# Patient Record
Sex: Female | Born: 1965
Health system: Southern US, Community
[De-identification: ages and names within clinical notes are randomized; demographics above are authoritative.]

## PROBLEM LIST (undated history)

## (undated) DIAGNOSIS — M8580 Other specified disorders of bone density and structure, unspecified site: Secondary | ICD-10-CM

## (undated) DIAGNOSIS — R569 Unspecified convulsions: Secondary | ICD-10-CM

## (undated) DIAGNOSIS — F419 Anxiety disorder, unspecified: Secondary | ICD-10-CM

## (undated) DIAGNOSIS — L409 Psoriasis, unspecified: Secondary | ICD-10-CM

## (undated) DIAGNOSIS — C439 Malignant melanoma of skin, unspecified: Secondary | ICD-10-CM

## (undated) DIAGNOSIS — G35 Multiple sclerosis: Secondary | ICD-10-CM

## (undated) DIAGNOSIS — C801 Malignant (primary) neoplasm, unspecified: Secondary | ICD-10-CM

## (undated) DIAGNOSIS — R519 Headache, unspecified: Secondary | ICD-10-CM

## (undated) DIAGNOSIS — T7840XA Allergy, unspecified, initial encounter: Secondary | ICD-10-CM

## (undated) DIAGNOSIS — E7800 Pure hypercholesterolemia, unspecified: Secondary | ICD-10-CM

## (undated) DIAGNOSIS — H539 Unspecified visual disturbance: Secondary | ICD-10-CM

## (undated) DIAGNOSIS — R51 Headache: Secondary | ICD-10-CM

## (undated) HISTORY — DX: Multiple sclerosis: G35

## (undated) HISTORY — DX: Allergy, unspecified, initial encounter: T78.40XA

## (undated) HISTORY — DX: Headache, unspecified: R51.9

## (undated) HISTORY — DX: Psoriasis, unspecified: L40.9

## (undated) HISTORY — DX: Malignant melanoma of skin, unspecified: C43.9

## (undated) HISTORY — DX: Headache: R51

## (undated) HISTORY — DX: Pure hypercholesterolemia, unspecified: E78.00

## (undated) HISTORY — DX: Anxiety disorder, unspecified: F41.9

## (undated) HISTORY — DX: Unspecified visual disturbance: H53.9

## (undated) HISTORY — PX: MELANOMA EXCISION: SHX5266

## (undated) HISTORY — DX: Unspecified convulsions: R56.9

## (undated) HISTORY — DX: Other specified disorders of bone density and structure, unspecified site: M85.80

## (undated) HISTORY — DX: Malignant (primary) neoplasm, unspecified: C80.1

## (undated) HISTORY — PX: OTHER SURGICAL HISTORY: SHX169

---

## 1991-02-20 HISTORY — PX: BREAST BIOPSY: SHX20

## 2008-08-03 ENCOUNTER — Ambulatory Visit: Payer: Self-pay | Admitting: Family Medicine

## 2008-08-03 DIAGNOSIS — L259 Unspecified contact dermatitis, unspecified cause: Secondary | ICD-10-CM | POA: Insufficient documentation

## 2009-08-01 ENCOUNTER — Ambulatory Visit: Payer: Self-pay | Admitting: Emergency Medicine

## 2009-08-01 DIAGNOSIS — F411 Generalized anxiety disorder: Secondary | ICD-10-CM | POA: Insufficient documentation

## 2009-08-01 DIAGNOSIS — S43429A Sprain of unspecified rotator cuff capsule, initial encounter: Secondary | ICD-10-CM | POA: Insufficient documentation

## 2009-08-01 DIAGNOSIS — M25519 Pain in unspecified shoulder: Secondary | ICD-10-CM | POA: Insufficient documentation

## 2010-02-19 HISTORY — PX: MYOMECTOMY: SHX85

## 2010-03-21 NOTE — Assessment & Plan Note (Signed)
Summary: POISON IVY/OAK/KH   Vital Signs:  Patient Profile:   45 Years Old Female CC:      poison ivy/oak Weight:      133 pounds O2 Sat:      99 % Temp:     97.8 degrees F oral Pulse rate:   78 / minute Resp:     16 per minute BP sitting:   138 / 94  (left arm) Cuff size:   regular  Pt. in pain?   no  Vitals Entered By: Angie Fava (August 03, 2008 6:57 PM)                   Updated Prior Medication List: * ORTHO 777 as directed  Current Allergies: No known allergies  History of Present Illness Chief Complaint: poison ivy/oak History of Present Illness: Subjective:  Patient complains of onset of several pruritic lesions on her arms and legs after pulling weeds 3 days ago.  No systemic symptoms.  She feels well otherwise.   REVIEW OF SYSTEMS Constitutional Symptoms      Denies fever, chills, night sweats, weight loss, weight gain, and fatigue.  Eyes       Denies change in vision, eye pain, eye discharge, glasses, contact lenses, and eye surgery. Ear/Nose/Throat/Mouth       Denies hearing loss/aids, change in hearing, ear pain, ear discharge, dizziness, frequent runny nose, frequent nose bleeds, sinus problems, sore throat, hoarseness, and tooth pain or bleeding.  Respiratory       Denies dry cough, productive cough, wheezing, shortness of breath, asthma, bronchitis, and emphysema/COPD.  Cardiovascular       Denies murmurs, chest pain, and tires easily with exhertion.    Gastrointestinal       Denies stomach pain, nausea/vomiting, diarrhea, constipation, blood in bowel movements, and indigestion. Genitourniary       Denies painful urination, kidney stones, and loss of urinary control. Neurological       Denies paralysis, seizures, and fainting/blackouts. Musculoskeletal       Denies muscle pain, joint pain, joint stiffness, decreased range of motion, redness, swelling, muscle weakness, and gout.  Skin       Denies bruising, unusual mles/lumps or sores, and  hair/skin or nail changes.  Psych       Denies mood changes, temper/anger issues, anxiety/stress, speech problems, depression, and sleep problems.  Past History:  Past Medical History: Unremarkable  Past Surgical History: melanoma removed from left arm May 13, 2008   Family History: mother, father and brother alive and healthy  Social History: drinks rarely denies smoking denies recreational drug use  Objective:  Appearance:  Patient appears healthy, stated age, and in no acute distress  Skin:  right forearm:  1.5 cm dia erythematous macular lesion.  On left forearm and lower legs are several small 2 to 3mm erythematous lesions.    Assessment New Problems: DERMATITIS (ICD-692.9)  ?contact dermatitis vs infected insect bite right arm  Plan New Medications/Changes: TRIAMCINOLONE ACETONIDE 0.1 % CREA (TRIAMCINOLONE ACETONIDE) Apply thin layer to affected area two times a day to three times a day  #15gm x 1, 08/03/2008, Donna Christen MD CEPHALEXIN 500 MG CAPS (CEPHALEXIN) One by mouth two times a day  #14 x 0, 08/03/2008, Donna Christen MD  New Orders: New Patient Level III [id number] Planning Comments:   Oral Keflex for 5 to 7 days.  Triamcinolone cream to lesions two times a day to three times a day. Return for worsening symptoms.  The patient and/or caregiver has been counseled thoroughly with regard to medications prescribed including dosage, schedule, interactions, rationale for use, and possible side effects and they verbalize understanding.  Diagnoses and expected course of recovery discussed and will return if not improved as expected or if the condition worsens. Patient and/or caregiver verbalized understanding.    Prescriptions: TRIAMCINOLONE ACETONIDE 0.1 % CREA (TRIAMCINOLONE ACETONIDE) Apply thin layer to affected area two times a day to three times a day  #15gm x 1   Entered and Authorized by:   Donna Christen MD   Signed by:   Donna Christen MD on  08/03/2008   Method used:   Print then Give to Patient   RxID:   [id number] CEPHALEXIN 500 MG CAPS (CEPHALEXIN) One by mouth two times a day  #14 x 0   Entered and Authorized by:   Donna Christen MD   Signed by:   Donna Christen MD on 08/03/2008   Method used:   Print then Give to Patient   RxID:   [id number]   ] ]

## 2010-03-21 NOTE — Assessment & Plan Note (Signed)
Summary: R shoulder pain, numbness, tingling radiating down R arm x 3 dys   Vital Signs:  Patient Profile:   45 Years Old Female CC:      R shoulder pain, numbness x 3 days Height:     60 inches Weight:      131 pounds O2 Sat:      100 % O2 treatment:    Room Air Temp:     98.0 degrees F oral Pulse rate:   52 / minute Pulse rhythm:   regular Resp:     16 per minute BP sitting:   158 / 104  (right arm) Cuff size:   regular  Vitals Entered By: Areta Haber CMA (August 01, 2009 5:58 PM)                  Current Allergies: No known allergies History of Present Illness History from: patient Chief Complaint: R shoulder pain, numbness x 3 days History of Present Illness: c/o Right shoulder pain x1 month.  No trauma, it just started.  She does work out at [employer]  Mild until 3 days ago when it started getting worse, some radiation to both fingertips.  Pain posterior shoulder and now anterior as well.  No OTC meds help. Pain 5/10  Current Problems: ROTATOR CUFF SPRAIN AND STRAIN (ICD-840.4) SHOULDER, PAIN (ICD-719.41) ANXIETY (ICD-300.00) DERMATITIS (ICD-692.9)   Current Meds LOESTRIN FE 1.5/30 1.5-30 MG-MCG TABS (NORETHIN ACE-ETH ESTRAD-FE) 1 tab by mouth once daily LEXAPRO 5 MG TABS (ESCITALOPRAM OXALATE) 1 tab by mouth once daily WELLBUTRIN XL 150 MG XR24H-TAB (BUPROPION HCL) 1 tab by mouth once daily ULTRACET 37.5-325 MG TABS (TRAMADOL-ACETAMINOPHEN) 1 tab by mouth Q6 hours as needed for pain  REVIEW OF SYSTEMS Constitutional Symptoms      Denies fever, chills, night sweats, weight loss, weight gain, and fatigue.  Eyes       Denies change in vision, eye pain, eye discharge, glasses, contact lenses, and eye surgery. Ear/Nose/Throat/Mouth       Denies hearing loss/aids, change in hearing, ear pain, ear discharge, dizziness, frequent runny nose, frequent nose bleeds, sinus problems, sore throat, hoarseness, and tooth pain or bleeding.  Respiratory       Denies dry  cough, productive cough, wheezing, shortness of breath, asthma, bronchitis, and emphysema/COPD.  Cardiovascular       Denies murmurs, chest pain, and tires easily with exhertion.    Gastrointestinal       Denies stomach pain, nausea/vomiting, diarrhea, constipation, blood in bowel movements, and indigestion. Genitourniary       Denies painful urination, kidney stones, and loss of urinary control. Neurological       Denies paralysis, seizures, and fainting/blackouts. Musculoskeletal       Denies muscle pain, joint pain, joint stiffness, decreased range of motion, redness, swelling, muscle weakness, and gout.  Skin       Denies bruising, unusual mles/lumps or sores, and hair/skin or nail changes.  Psych       Denies mood changes, temper/anger issues, anxiety/stress, speech problems, depression, and sleep problems. Other Comments: R shoulder pain, numbness, radiating down R arm x 3 dys. Pt has not seen PCP for this.   Past History:  Past Medical History:  Anxiety  Family History: mother, father and brother alive and healthy Family History High cholesterol Family History Hypertension Family History of Skin cancer Family History of Stroke M 1st degree relative <50  Social History: drinks rarely denies smoking denies recreational drug use Single Regular  exercise-yes Does Patient Exercise:  yes Physical Exam General appearance: well developed, well nourished, no acute distress Chest/Lungs: no rales, wheezes, or rhonchi bilateral, breath sounds equal without effort Heart: regular rate and  rhythm, no murmur Extremities: Right shoulder: FROM, painful hawkins and neers, scapular dyskinesia, Obrien's neg, scaption painful, TTP rhomboids, none paraspinal, normal speeds and yergusons, no bony tenderness Neurological: grossly intact and non-focal Skin: no obvious rashes or lesions Assessment New Problems: ROTATOR CUFF SPRAIN AND STRAIN (ICD-840.4) SHOULDER, PAIN  (ICD-719.41) ANXIETY (ICD-300.00)   Plan New Medications/Changes: ULTRACET 37.5-325 MG TABS (TRAMADOL-ACETAMINOPHEN) 1 tab by mouth Q6 hours as needed for pain  #30 x 0, 08/01/2009, Hoyt Koch MD  New Orders: New Patient Level III [phone]  The patient and/or caregiver has been counseled thoroughly with regard to medications prescribed including dosage, schedule, interactions, rationale for use, and possible side effects and they verbalize understanding.  Diagnoses and expected course of recovery discussed and will return if not improved as expected or if the condition worsens. Patient and/or caregiver verbalized understanding.  Prescriptions: ULTRACET 37.5-325 MG TABS (TRAMADOL-ACETAMINOPHEN) 1 tab by mouth Q6 hours as needed for pain  #30 x 0   Entered and Authorized by:   Hoyt Koch MD   Signed by:   Hoyt Koch MD on 08/01/2009   Method used:   Handwritten   RxID:   [id number]   Patient Instructions: 1)  f/u with PT for exercises.  Ultracet for pain.  Ice, and gentle ROM.  RTC in a few weeks if not significantly improving.  No Xrays performed today.  Orders Added: 1)  New Patient Level III [02542]

## 2015-09-29 DIAGNOSIS — R4182 Altered mental status, unspecified: Secondary | ICD-10-CM | POA: Diagnosis not present

## 2015-10-07 DIAGNOSIS — D72829 Elevated white blood cell count, unspecified: Secondary | ICD-10-CM | POA: Diagnosis not present

## 2015-10-07 DIAGNOSIS — R404 Transient alteration of awareness: Secondary | ICD-10-CM | POA: Diagnosis not present

## 2015-10-07 DIAGNOSIS — E7800 Pure hypercholesterolemia, unspecified: Secondary | ICD-10-CM | POA: Diagnosis not present

## 2015-10-07 DIAGNOSIS — Z823 Family history of stroke: Secondary | ICD-10-CM | POA: Diagnosis not present

## 2015-11-22 DIAGNOSIS — R9082 White matter disease, unspecified: Secondary | ICD-10-CM | POA: Diagnosis not present

## 2015-11-22 DIAGNOSIS — R479 Unspecified speech disturbances: Secondary | ICD-10-CM | POA: Diagnosis not present

## 2015-11-29 DIAGNOSIS — G43909 Migraine, unspecified, not intractable, without status migrainosus: Secondary | ICD-10-CM | POA: Diagnosis not present

## 2015-11-29 DIAGNOSIS — I6521 Occlusion and stenosis of right carotid artery: Secondary | ICD-10-CM | POA: Diagnosis not present

## 2015-12-08 DIAGNOSIS — R200 Anesthesia of skin: Secondary | ICD-10-CM | POA: Diagnosis not present

## 2015-12-08 DIAGNOSIS — G9389 Other specified disorders of brain: Secondary | ICD-10-CM | POA: Diagnosis not present

## 2015-12-08 DIAGNOSIS — R938 Abnormal findings on diagnostic imaging of other specified body structures: Secondary | ICD-10-CM | POA: Diagnosis not present

## 2015-12-08 DIAGNOSIS — R9089 Other abnormal findings on diagnostic imaging of central nervous system: Secondary | ICD-10-CM | POA: Diagnosis not present

## 2015-12-08 DIAGNOSIS — M5416 Radiculopathy, lumbar region: Secondary | ICD-10-CM | POA: Diagnosis not present

## 2015-12-21 DIAGNOSIS — Z79899 Other long term (current) drug therapy: Secondary | ICD-10-CM | POA: Diagnosis not present

## 2015-12-21 DIAGNOSIS — M5416 Radiculopathy, lumbar region: Secondary | ICD-10-CM | POA: Diagnosis not present

## 2015-12-21 DIAGNOSIS — G5603 Carpal tunnel syndrome, bilateral upper limbs: Secondary | ICD-10-CM | POA: Diagnosis not present

## 2015-12-21 DIAGNOSIS — R9089 Other abnormal findings on diagnostic imaging of central nervous system: Secondary | ICD-10-CM | POA: Diagnosis not present

## 2015-12-21 DIAGNOSIS — R200 Anesthesia of skin: Secondary | ICD-10-CM | POA: Diagnosis not present

## 2016-01-06 DIAGNOSIS — Z23 Encounter for immunization: Secondary | ICD-10-CM | POA: Diagnosis not present

## 2016-01-25 ENCOUNTER — Ambulatory Visit (INDEPENDENT_AMBULATORY_CARE_PROVIDER_SITE_OTHER): Payer: BLUE CROSS/BLUE SHIELD | Admitting: Neurology

## 2016-01-25 ENCOUNTER — Encounter: Payer: Self-pay | Admitting: Neurology

## 2016-01-25 VITALS — BP 118/86 | HR 78 | Resp 16 | Ht 60.0 in | Wt 135.5 lb

## 2016-01-25 DIAGNOSIS — R9089 Other abnormal findings on diagnostic imaging of central nervous system: Secondary | ICD-10-CM

## 2016-01-25 DIAGNOSIS — R404 Transient alteration of awareness: Secondary | ICD-10-CM | POA: Diagnosis not present

## 2016-01-25 DIAGNOSIS — R29898 Other symptoms and signs involving the musculoskeletal system: Secondary | ICD-10-CM

## 2016-01-25 DIAGNOSIS — M7061 Trochanteric bursitis, right hip: Secondary | ICD-10-CM | POA: Diagnosis not present

## 2016-01-25 DIAGNOSIS — M7071 Other bursitis of hip, right hip: Secondary | ICD-10-CM | POA: Insufficient documentation

## 2016-01-25 DIAGNOSIS — F411 Generalized anxiety disorder: Secondary | ICD-10-CM

## 2016-01-25 MED ORDER — METHYLPREDNISOLONE 4 MG PO TABS: ORAL_TABLET | ORAL | 0 refills | Status: DC

## 2016-01-25 MED ORDER — METHYLPREDNISOLONE 4 MG PO TABS
4.0000 mg | ORAL_TABLET | Freq: Every day | ORAL | 0 refills | Status: DC
Start: 2016-01-25 — End: 2016-01-25

## 2016-01-25 NOTE — Progress Notes (Signed)
[facility]  PATIENT: Amy Hopkins DOB: 1966/02/07  REFERRING DOCTOR OR PCP:  Finis Bud SOURCE: Patient, notes from Dr. Dory Larsen and Dr. Trula Ore, MRI reports, lab reports, MRI images on CD  _________________________________   HISTORICAL  CHIEF COMPLAINT:  Chief Complaint  Patient presents with  . Abnormal MRI    Amy Hopkins is here with her husband Amy Hopkins for eval of MS.  Sts. she was dx. on 11-29-15.  Sts. presenting sx. was an episode of altered awareness, while she was speakikng with her husband. "I felt like I was in a tunnel."  She had difficulty speaking--husband sts. speech was slow.  She was seen and treated in the ER at [facility], dx. with altered loc.  Her pcp referred her to [facility].  She saw Dr. Minna Antis, sts. had an MRI which she was told showed MS lesions.  Sts. she had an LP which was   . Speech Disturbance    negative.  She has a hx. of a single sx. in 2011.  Sts. they believe sz. was due to taking Tramadol and Wellbutrin at the same time. She is on Lamictal for same.  She also has a hx. of migraines, onset in her 80's.  Hx. of melanoma left arm a few yrs. ago./fim    HISTORY OF PRESENT ILLNESS:  I had hte pleasure of seeing your patient, Amy Hopkins, at [facility] for a neurologic consultation regarding her possible MS.  IN 2011, She had a generalized tonic-clonic seizure while in the store with her husband. She was taken to the emergency room. She had an EEG and an MRI. The EEG was reportedly normal at that time. The MRI had some white matter foci that were felt to be nonspecific.   She did well until 09/29/2015. She was in her home when she had the onset of generalized weakness. Her arms were at her side. She also was speaking slowly. However, she was able to understand well and there was no aphasia.  She was able to walk independently. She was taken to the emergency room and had a CT scan that was reportedly  normal.  The worst of her symptoms lasted about 15-30 minutes later that day [facility] was practically back to baseline though she still felt a little weak. The next day she felt back to normal. She was advised to follow-up with neurology. She saw Dr. Trula Ore shortly after that. He ordered an EEG and carotid Doppler and MRI scan.   Based on the MRI that showed that there were more white matter foci compared to the previous MRI, a lumbar puncture was also performed. The lumbar puncture was reportedly normal (I do not have those results at this time) .    She went back to see Dr. Trula Ore and he felt that she most likely had multiple sclerosis and Copaxone was prescribed.        The EEG was read as abnormal and she was started on lamotrigine 100 mg by mouth twice a day. She tolerates it well. She has not had more spells.  She had a nerve conduction and EMG study performed. It showed that she had the nerve conduction study showed moderate right and mild left carpal tunnel syndrome and no-shows showed mild cervical and lumbosacral radiculopathies.    Despite study,  she has not reported symptoms consistent with radiculopathy with no pain, numbness or weakness.  She has a long history of migraine headaches. For the most part, these  are common migraine headaches without aura. She will get pounding bilateral pain, photophobia, phonophobia and nausea and sometimes vomiting. Her head will make the pain worse. Imitrex usually helps the pain.  She has not had any spells of weakness, numbness or clumsiness, not counting the one spell of a few months ago.    She has not had significant problems with bladder or vision.  She has some anxiety and is on Lexapro. She has not had cognitive dysfunction. She notes some fatigue and sleepiness. She does sleep well at night with just some snoring but no pauses or gasping her breathing.  There is no family history of MS. Her mother had a brain stem stroke with the locked-in syndrome  and her grandmother had TIAs.  I personally reviewed the MRI of the brain from August 2017. It shows multiple T2/FLAIR hyperintense foci in both hemispheres, predominantly in the subcortical and deep white matter. There was 1:30 oriented periventricular focus on the left. None of the foci appeared to be acute. The brainstem, cerebellum and deep gray matter were normal.  I also reviewed the results of the Doppler study (normal for age) and EEG (read as showing right parietal and temporal slowing) and NCV/EMG (read as showing moderate right and mild left median neuropathies at the wrist and multilevel lumbosacral and cervical radiculopathies)  REVIEW OF SYSTEMS: Constitutional: No fevers, chills, sweats, or change in appetite Eyes: No visual changes, double vision, eye pain Ear, nose and throat: No hearing loss, ear pain, nasal congestion, sore throat Cardiovascular: No chest pain, palpitations Respiratory: No shortness of breath at rest or with exertion.   No wheezes GastrointestinaI: No nausea, vomiting, diarrhea, abdominal pain, fecal incontinence Genitourinary: No dysuria, urinary retention or frequency.  No nocturia. Musculoskeletal: No neck pain, back pain Integumentary: No rash, pruritus, skin lesions Neurological: as above Psychiatric: No depression at this time.  No anxiety Endocrine: No palpitations, diaphoresis, change in appetite, change in weigh or increased thirst Hematologic/Lymphatic: No anemia, purpura, petechiae. Allergic/Immunologic: No itchy/runny eyes, nasal congestion, recent allergic reactions, rashes  ALLERGIES: Allergies  Allergen Reactions  . Tramadol Other (See Comments)    HOME MEDICATIONS:  Current Outpatient Prescriptions:  .  acetaminophen (TYLENOL) 500 MG tablet, Take by mouth., Disp: , Rfl:  .  COPAXONE 40 MG/ML SOSY, , Disp: , Rfl:  .  escitalopram (LEXAPRO) 10 MG tablet, Take by mouth., Disp: , Rfl:  .  fluocinonide (LIDEX) 0.05 % external  solution, , Disp: , Rfl:  .  ibuprofen (ADVIL,MOTRIN) 200 MG tablet, Take by mouth., Disp: , Rfl:  .  lamoTRIgine (LAMICTAL) 100 MG tablet, , Disp: , Rfl:  .  SUMAtriptan (IMITREX) 100 MG tablet, Take by mouth., Disp: , Rfl:  .  SUMAtriptan (IMITREX) 100 MG tablet, , Disp: , Rfl:  .  methylPREDNISolone (MEDROL) 4 MG tablet, Take as directed over 6 days, Disp: 21 tablet, Rfl: 0  PAST MEDICAL HISTORY: Past Medical History:  Diagnosis Date  . Cancer ([place])   . Headache   . Melanoma ([place])   . Multiple sclerosis ([place])   . Psoriasis   . Seizures ([place])   . Vision abnormalities     PAST SURGICAL HISTORY: Past Surgical History:  Procedure Laterality Date  . MELANOMA EXCISION    . MYOMECTOMY      FAMILY HISTORY: Family History  Problem Relation Age of Onset  . Stroke Mother   . Breast cancer Mother   . Hypertension Father   . High Cholesterol Father   .  Psoriasis Father   . Healthy Brother   . Transient ischemic attack Maternal Grandmother     SOCIAL HISTORY:  Social History   Social History  . Marital status: Single    Spouse name: N/A  . Number of children: N/A  . Years of education: N/A   Occupational History  . Not on file.   Social History Main Topics  . Smoking status: Former Research scientist (life sciences)  . Smokeless tobacco: Never Used  . Alcohol use Yes     Comment: occasional  . Drug use: No  . Sexual activity: Not on file   Other Topics Concern  . Not on file   Social History Narrative  . No narrative on file     PHYSICAL EXAM  Vitals:   01/25/16 1346  BP: 118/86  Pulse: 78  Resp: 16  Weight: 135 lb 8 oz (61.5 kg)  Height: 5' (1.524 m)    Body mass index is 26.46 kg/m.   General: The patient is well-developed and well-nourished and in no acute distress  Eyes:  Funduscopic exam shows normal optic discs and retinal vessels.  Head/Neck: Head is Newman/AT.   The neck is supple, no carotid bruits are noted.  The neck is nontender.   Soft palate is  elongated.  Cardiovascular: The heart has a regular rate and rhythm with a normal S1 and S2. There were no murmurs, gallops or rubs. Lungs are clear to auscultation.  Skin: Extremities are without significant edema.  Musculoskeletal:  Back is nontender.  She is tender over the right trochanteric bursa consistent with bursitis.  Neurologic Exam  Mental status: The patient is alert and oriented x 3 at the time of the examination. The patient has apparent normal recent and remote memory, with an apparently normal attention span and concentration ability.   Speech is normal.  Cranial nerves: Extraocular movements are full. Pupils are equal, round, and reactive to light and accomodation.  Visual fields are full.  Facial symmetry is present. There is good facial sensation to soft touch bilaterally.Facial strength is normal.  Trapezius and sternocleidomastoid strength is normal. No dysarthria is noted.  The tongue is midline, and the patient has symmetric elevation of the soft palate. No obvious hearing deficits are noted.  Motor:  Muscle bulk is normal.   Tone is normal. Strength is  5 / 5 in all 4 extremities.   Sensory: Sensory testing is intact to pinprick, soft touch and vibration sensation in all 4 extremities.  Coordination: Cerebellar testing reveals good finger-nose-finger and heel-to-shin bilaterally.  Gait and station: Station is normal.   Gait is normal. Tandem gait is normal. Romberg is negative.   Reflexes: Deep tendon reflexes are symmetric and normal bilaterally.   Plantar responses are flexor.      DIAGNOSTIC DATA (LABS, IMAGING, TESTING) - I reviewed patient records, labs, notes, testing and imaging myself where available.      ASSESSMENT AND PLAN  Abnormal brain MRI - Plan: ECHOCARDIOGRAM COMPLETE, MR CERVICAL SPINE W WO CONTRAST  Transient alteration of awareness - Plan: ECHOCARDIOGRAM COMPLETE  Arm weakness - Plan: MR CERVICAL SPINE W WO CONTRAST  Anxiety  state  Trochanteric bursitis of right hip   In summary, Jahasia Naimi is a 50 year old woman with an abnormal MRI showing multiple white matter foci, predominantly in the subcortical and deep white matter.    She has not had any episodes of numbness, weakness, vision changes or clumsiness as would be expected with multiple sclerosis. I personally reviewed  the MRI of the brain and concur that some of the foci could be consistent with MS but that most of the foci are nonspecific. I have requested the 2011 MRI to do a side-by-side comparison. I discussed with her that without clinical relapse history (August symptoms not typical for MS), normal CSF and borderline MRI, I feel that the chance that she has MS is closer to 30-50% rather than a higher likelihood.  Therefore, I would not recommend starting a disease modifying therapy at this time. I would like to recheck an MRI of the brain in one year to see if there has been progression. Additionally, we will go ahead and check an MRI of the cervical spine to determine if there are any plaques consistent with MS in the spinal cord. If present, or if a later MRI shows changes consistent with MS, this will greatly increase the likelihood of MS and then I would want her to start a disease modifying therapy.     Her MRI showed some right-sided slowing and I am uncertain if that explains her symptoms she experienced in August or not but I recommend that she continue the lamotrigine at this time and I would consider stopping later if she remains episode free.   I am not sure what to make of the multilevel radiculopathy on her EMG study as she is not symptomatic with no pain, numbness, weakness or atrophy. The MRI of the cervical spine will help to clarify this as well. The foci on the brain MRI could represent cardial emboli and we will check a bubble contrasted echocardiogram.   Unrelated to her other symptoms, she has right trochanteric bursa tenderness. I called in a  steroid pack and could do a bursa injection if pain does not improve.  She will return to see me in 2 months or sooner if there are new or worsening neurologic symptoms and we will let her know the results of the studies as they are performed.  Thank you for asking me to see Mrs. Berline Lopes for a neurologic consultation. Please let me know if I can be of further assistance with her or other patients in the future.   Jhanvi Drakeford A. Felecia Shelling, MD, PhD [id number], [id number] PM Certified in Neurology, Clinical Neurophysiology, Sleep Medicine, Pain Medicine and Neuroimaging  [facility] [address] [place], [place] [id number] [phone]

## 2016-01-27 ENCOUNTER — Telehealth: Payer: Self-pay | Admitting: *Deleted

## 2016-01-27 DIAGNOSIS — M50221 Other cervical disc displacement at C4-C5 level: Secondary | ICD-10-CM | POA: Diagnosis not present

## 2016-01-27 NOTE — Telephone Encounter (Signed)
Release faxed to [facility] requesting mri brain/spine 2011.

## 2016-01-30 ENCOUNTER — Ambulatory Visit (INDEPENDENT_AMBULATORY_CARE_PROVIDER_SITE_OTHER): Payer: Self-pay

## 2016-01-30 DIAGNOSIS — R29898 Other symptoms and signs involving the musculoskeletal system: Secondary | ICD-10-CM

## 2016-01-30 DIAGNOSIS — Z0289 Encounter for other administrative examinations: Secondary | ICD-10-CM

## 2016-01-30 DIAGNOSIS — R9089 Other abnormal findings on diagnostic imaging of central nervous system: Secondary | ICD-10-CM

## 2016-02-01 ENCOUNTER — Telehealth: Payer: Self-pay | Admitting: *Deleted

## 2016-02-01 NOTE — Telephone Encounter (Signed)
-----   Message from Britt Bottom, MD sent at 01/31/2016  6:00 PM EST ----- (may be duplicate)   please let her know that the MRI of the cervical spine showsarthritic and disc degenerative changes with spinal stenosis, however,  the spinal cord appears normal (no evidence of MS)

## 2016-02-01 NOTE — Telephone Encounter (Signed)
I have spoken with Amy Hopkins this morning, and per RAS, explained that MRI cervical spine showed arthritic, degenerative wear and tear changes, also some mild narrowing, but spinal cord looks normal and there is no evidence of MS. She verbalized understanding of same, wonders when ECHO will be sched.  I will check with Hinton Dyer on this/fim

## 2016-02-01 NOTE — Telephone Encounter (Signed)
duplicate/fim  

## 2016-02-01 NOTE — Telephone Encounter (Signed)
-----   Message from Britt Bottom, MD sent at 01/31/2016  5:58 PM EST ----- Please let her know that the MRI of the cervical spine shows arthritic and disc degenerative changes with mild spinal stenosis. However, the spinal cord appears normal (no evidence of MS)

## 2016-02-02 NOTE — Telephone Encounter (Signed)
Spoke to Patient she is scheduled for Her Echo  Jan 3rd arrive at 1:45 for 2:00 apt . Patient has been approved auth # [id number] 02/01/2017 01/01-2017

## 2016-02-02 NOTE — Telephone Encounter (Signed)
Noted/fim 

## 2016-02-07 ENCOUNTER — Telehealth: Payer: Self-pay | Admitting: Neurology

## 2016-02-07 NOTE — Telephone Encounter (Signed)
I compared the MRI's of the brain from 08/24/2009 and 11/22/2015.    Both showed nonspecific white matter foci predominantly in the subcortical and deep white matter of both hemispheres. In the 6 year interim, a couple more foci have developed though the pattern remains nonspecific.

## 2016-02-17 ENCOUNTER — Other Ambulatory Visit: Payer: Self-pay

## 2016-02-17 ENCOUNTER — Ambulatory Visit (HOSPITAL_COMMUNITY): Payer: BLUE CROSS/BLUE SHIELD | Attending: Cardiovascular Disease

## 2016-02-17 DIAGNOSIS — R9089 Other abnormal findings on diagnostic imaging of central nervous system: Secondary | ICD-10-CM | POA: Diagnosis not present

## 2016-02-17 DIAGNOSIS — R404 Transient alteration of awareness: Secondary | ICD-10-CM

## 2016-02-22 ENCOUNTER — Other Ambulatory Visit (HOSPITAL_COMMUNITY): Payer: Self-pay

## 2016-02-22 NOTE — Telephone Encounter (Signed)
LMOM that per RAS, echo was ok.  She does not need to return this call unless she has questions/fim

## 2016-02-22 NOTE — Telephone Encounter (Signed)
-----   Message from Britt Bottom, MD sent at 02/22/2016  9:37 AM EST ----- Please note that the echocardiogram was fine.

## 2016-03-27 ENCOUNTER — Encounter: Payer: Self-pay | Admitting: Neurology

## 2016-03-27 ENCOUNTER — Ambulatory Visit (INDEPENDENT_AMBULATORY_CARE_PROVIDER_SITE_OTHER): Payer: BLUE CROSS/BLUE SHIELD | Admitting: Neurology

## 2016-03-27 ENCOUNTER — Telehealth: Payer: Self-pay | Admitting: Neurology

## 2016-03-27 VITALS — BP 120/78 | HR 64 | Resp 14 | Ht 60.0 in | Wt 137.0 lb

## 2016-03-27 DIAGNOSIS — R404 Transient alteration of awareness: Secondary | ICD-10-CM | POA: Diagnosis not present

## 2016-03-27 DIAGNOSIS — R9089 Other abnormal findings on diagnostic imaging of central nervous system: Secondary | ICD-10-CM | POA: Diagnosis not present

## 2016-03-27 DIAGNOSIS — F411 Generalized anxiety disorder: Secondary | ICD-10-CM | POA: Diagnosis not present

## 2016-03-27 DIAGNOSIS — G43009 Migraine without aura, not intractable, without status migrainosus: Secondary | ICD-10-CM

## 2016-03-27 DIAGNOSIS — G43909 Migraine, unspecified, not intractable, without status migrainosus: Secondary | ICD-10-CM | POA: Insufficient documentation

## 2016-03-27 NOTE — Telephone Encounter (Signed)
PT NEXT APPT SATER 09/25/16. PER PT SATER WANTS MRI DONE ONE OR TWO WEEKS PRIOR TO AUG APPT. PT STATES NEEDS OPEN MRI

## 2016-03-27 NOTE — Progress Notes (Signed)
[facility]  PATIENT: Amy Hopkins DOB: [birth year]  REFERRING DOCTOR OR PCP:  Finis Bud SOURCE: Patient, notes from Dr. Dory Larsen and Dr. Trula Ore, MRI reports, lab reports, MRI images on CD  _________________________________   HISTORICAL  CHIEF COMPLAINT:  Chief Complaint  Patient presents with  . Abnormal MRI    Here for recheck--currently is being r/o for MS.  Would like to discuss next MRI.   Denies new or worsening sx. /fim    HISTORY OF PRESENT ILLNESS:  I had hte pleasure of seeing your patient, Amy Hopkins, at [facility] for a neurologic consultation regarding her possible MS.  Transient alteration of awareness.   She had a seizure in 2011 and an episode Of altered awareness with weakness August 2017.  The first spell occurred in the setting of Wellbutrin and tramadol as risk factors and she had generalized tonic-clonic activity. The second spell did not have any precipitating factor though it is uncertain whether it represented a seizure as there was no loss of consciousness.   When she was a teenager, she had a couple episodes of very brief syncope preceded by lightheadedness but the other spells were very different.     She went to [facility] emergency room and was referred to Dr. Trula Ore.  The EEG was read as abnormal (right parietal and temporal slowing) and she was started on lamotrigine 100 mg by mouth twice a day. She tolerates it well. She has not had more spells.  Her mother had a brain stem stroke with the locked-in syndrome and her grandmother had TIAs.  Abnormal MRI/Possible MS:  Because of the spell in August, she had an MRI in September.   It shows some white matter foci. I was able to get her previous MRI from 2011 and compared. There were abnormal white matter foci on the prior scan that had been some progression in the interim.   There was a concern about multiple sclerosis and she was prescribed Copaxone and referred  for second opinion.   She has not had any episodes of weakness, clumsiness, vision changes, gait changes persisting for more than a few minutes.  He does get intermittent numbness and tingling in the hands (see below).  We had previously discussed that the MRI changes could represent chronic microvascular ischemic change rather than MS though some of the foci are periventricular.   A lumbar puncture was reportedly negative.  here is no family history of MS.   Migraine:    She has a long history of migraine headaches. For the most part, these are common migraine headaches without aura. She will get pounding bilateral pain, photophobia, phonophobia and nausea and sometimes vomiting. Her head will make the pain worse. Imitrex usually helps the pain.   These are actually occurring less since starting lamotrigine.  Hand numbness, carpal tunnel syndrome:   She had a nerve conduction and EMG study performed. It showed that she had the nerve conduction study showed moderate right and mild left carpal tunnel syndrome and no-shows showed mild cervical and lumbosacral radiculopathies.    Anxiety:  She has some anxiety and is on Lexapro. She has not had cognitive dysfunction. She notes some fatigue and sleepiness. She does sleep well at night with just some snoring but no pauses or gasping her breathing.  I have reviewed the MRI of the brain from August 2017. It shows multiple T2/FLAIR hyperintense foci in both hemispheres, predominantly in the subcortical and deep white matter. There  was radially oriented periventricular focus on the left. None of the foci appeared to be acute. The brainstem, cerebellum and deep gray matter were normal.  I also reviewed the results of the Doppler study (normal for age) and EEG (read as showing right parietal and temporal slowing) and NCV/EMG (read as showing moderate right and mild left median neuropathies at the wrist and multilevel lumbosacral and cervical radiculopathies)  REVIEW  OF SYSTEMS: Constitutional: No fevers, chills, sweats, or change in appetite Eyes: No visual changes, double vision, eye pain Ear, nose and throat: No hearing loss, ear pain, nasal congestion, sore throat Cardiovascular: No chest pain, palpitations Respiratory: No shortness of breath at rest or with exertion.   No wheezes GastrointestinaI: No nausea, vomiting, diarrhea, abdominal pain, fecal incontinence Genitourinary: No dysuria, urinary retention or frequency.  No nocturia. Musculoskeletal: No neck pain, back pain Integumentary: No rash, pruritus, skin lesions Neurological: as above Psychiatric: No depression at this time.  No anxiety Endocrine: No palpitations, diaphoresis, change in appetite, change in weigh or increased thirst Hematologic/Lymphatic: No anemia, purpura, petechiae. Allergic/Immunologic: No itchy/runny eyes, nasal congestion, recent allergic reactions, rashes  ALLERGIES: Allergies  Allergen Reactions  . Tramadol Other (See Comments)    HOME MEDICATIONS:  Current Outpatient Prescriptions:  .  acetaminophen (TYLENOL) 500 MG tablet, Take by mouth., Disp: , Rfl:  .  COPAXONE 40 MG/ML SOSY, , Disp: , Rfl:  .  escitalopram (LEXAPRO) 10 MG tablet, Take by mouth., Disp: , Rfl:  .  fluocinonide (LIDEX) 0.05 % external solution, , Disp: , Rfl:  .  ibuprofen (ADVIL,MOTRIN) 200 MG tablet, Take by mouth., Disp: , Rfl:  .  lamoTRIgine (LAMICTAL) 100 MG tablet, , Disp: , Rfl:  .  methylPREDNISolone (MEDROL) 4 MG tablet, Take as directed over 6 days, Disp: 21 tablet, Rfl: 0 .  SUMAtriptan (IMITREX) 100 MG tablet, Take by mouth., Disp: , Rfl:  .  SUMAtriptan (IMITREX) 100 MG tablet, , Disp: , Rfl:   PAST MEDICAL HISTORY: Past Medical History:  Diagnosis Date  . Cancer ([place])   . Headache   . Melanoma ([place])   . Multiple sclerosis ([place])   . Psoriasis   . Seizures ([place])   . Vision abnormalities     PAST SURGICAL HISTORY: Past Surgical History:  Procedure Laterality  Date  . MELANOMA EXCISION    . MYOMECTOMY      FAMILY HISTORY: Family History  Problem Relation Age of Onset  . Stroke Mother   . Breast cancer Mother   . Hypertension Father   . High Cholesterol Father   . Psoriasis Father   . Healthy Brother   . Transient ischemic attack Maternal Grandmother     SOCIAL HISTORY:  Social History   Social History  . Marital status: Single    Spouse name: N/A  . Number of children: N/A  . Years of education: N/A   Occupational History  . Not on file.   Social History Main Topics  . Smoking status: Former Research scientist (life sciences)  . Smokeless tobacco: Never Used  . Alcohol use Yes     Comment: occasional  . Drug use: No  . Sexual activity: Not on file   Other Topics Concern  . Not on file   Social History Narrative  . No narrative on file     PHYSICAL EXAM  Vitals:   03/27/16 1554  BP: 120/78  Pulse: 64  Resp: 14  Weight: 137 lb (62.1 kg)  Height: 5' (1.524 m)  Body mass index is 26.76 kg/m.   General: The patient is well-developed and well-nourished and in no acute distress   Neurologic Exam  Mental status: The patient is alert and oriented x 3 at the time of the examination. The patient has apparent normal recent and remote memory, with an apparently normal attention span and concentration ability.   Speech is normal.  Cranial nerves: Extraocular movements are full.  There is good facial sensation to soft touch bilaterally.Facial strength is normal.  Trapezius and sternocleidomastoid strength is normal. No dysarthria is noted.  The tongue is midline, and the patient has symmetric elevation of the soft palate. No obvious hearing deficits are noted.  Motor:  Muscle bulk is normal.   Tone is normal. Strength is  5 / 5 in all 4 extremities.   Sensory: Sensory testing is intact to pinprick, soft touch and vibration sensation in all 4 extremities.  Coordination: Cerebellar testing reveals good finger-nose-finger and heel-to-shin  bilaterally.  Gait and station: Station is normal.   Gait is normal. Tandem gait is normal. Romberg is negative.   Reflexes: Deep tendon reflexes are symmetric.   DTRs were 3 and symmetric at the knees and 2+ elsewherer.      DIAGNOSTIC DATA (LABS, IMAGING, TESTING) - I reviewed patient records, labs, notes, testing and imaging myself where available.      ASSESSMENT AND PLAN  Abnormal brain MRI  Transient alteration of awareness  Anxiety state  Migraine without aura and without status migrainosus, not intractable   1.   We had a long discussion about the MRIs showing white matter foci but no definite evidence of multiple sclerosis.   I feel the likelihood that she has MS is 30-40%.   Would not recommend treatment at this time.  I would like to check another MRI of the brain later this year and compare with her previous one. If that one does not show any progression, the likelihood of MS is even lower. 2.   For the time being, she should continue on lamotrigine. However, she could consider going off as it is not certain that the episode in August was a seizure he showed mild asymmetry but not definite epileptiform activity. 3.    She will return to see me in 6 months and we will check an MRI around that time.  He should call sooner if any new or worsening neurologic symptoms.  Kevyn Boquet A. Felecia Shelling, MD, PhD [id number], [id number] PM Certified in Neurology, Clinical Neurophysiology, Sleep Medicine, Pain Medicine and Neuroimaging  [facility] [address] [place], [place] [address] [phone]

## 2016-03-28 NOTE — Telephone Encounter (Signed)
Noted, thank you

## 2016-04-09 DIAGNOSIS — Z823 Family history of stroke: Secondary | ICD-10-CM | POA: Diagnosis not present

## 2016-04-09 DIAGNOSIS — Z0000 Encounter for general adult medical examination without abnormal findings: Secondary | ICD-10-CM | POA: Diagnosis not present

## 2016-04-09 DIAGNOSIS — G43909 Migraine, unspecified, not intractable, without status migrainosus: Secondary | ICD-10-CM | POA: Diagnosis not present

## 2016-04-09 DIAGNOSIS — E559 Vitamin D deficiency, unspecified: Secondary | ICD-10-CM | POA: Diagnosis not present

## 2016-04-09 DIAGNOSIS — L409 Psoriasis, unspecified: Secondary | ICD-10-CM | POA: Diagnosis not present

## 2016-04-09 DIAGNOSIS — Z1231 Encounter for screening mammogram for malignant neoplasm of breast: Secondary | ICD-10-CM | POA: Diagnosis not present

## 2016-04-23 DIAGNOSIS — Z780 Asymptomatic menopausal state: Secondary | ICD-10-CM | POA: Diagnosis not present

## 2016-04-24 ENCOUNTER — Encounter: Payer: Self-pay | Admitting: Neurology

## 2016-05-07 DIAGNOSIS — L400 Psoriasis vulgaris: Secondary | ICD-10-CM | POA: Diagnosis not present

## 2016-05-07 DIAGNOSIS — Z85828 Personal history of other malignant neoplasm of skin: Secondary | ICD-10-CM | POA: Diagnosis not present

## 2016-05-07 DIAGNOSIS — Z85820 Personal history of malignant melanoma of skin: Secondary | ICD-10-CM | POA: Diagnosis not present

## 2016-05-07 DIAGNOSIS — Z1231 Encounter for screening mammogram for malignant neoplasm of breast: Secondary | ICD-10-CM | POA: Diagnosis not present

## 2016-05-07 DIAGNOSIS — L814 Other melanin hyperpigmentation: Secondary | ICD-10-CM | POA: Diagnosis not present

## 2016-05-07 DIAGNOSIS — C44712 Basal cell carcinoma of skin of right lower limb, including hip: Secondary | ICD-10-CM | POA: Diagnosis not present

## 2016-07-30 ENCOUNTER — Telehealth: Payer: Self-pay | Admitting: Neurology

## 2016-07-30 DIAGNOSIS — R9089 Other abnormal findings on diagnostic imaging of central nervous system: Secondary | ICD-10-CM

## 2016-07-30 DIAGNOSIS — R200 Anesthesia of skin: Secondary | ICD-10-CM

## 2016-07-30 DIAGNOSIS — G43809 Other migraine, not intractable, without status migrainosus: Secondary | ICD-10-CM

## 2016-07-30 DIAGNOSIS — R404 Transient alteration of awareness: Secondary | ICD-10-CM

## 2016-07-30 DIAGNOSIS — R569 Unspecified convulsions: Secondary | ICD-10-CM

## 2016-07-30 DIAGNOSIS — Z8669 Personal history of other diseases of the nervous system and sense organs: Secondary | ICD-10-CM

## 2016-07-30 NOTE — Telephone Encounter (Signed)
Pt wants to proceed with MRI

## 2016-07-30 NOTE — Telephone Encounter (Signed)
MRI ordered per plan in last ov note. (continuiing to r/o MS). Hx. sz., migraines, transient alt. of awareness, numbness, abnormal mri brain in the past.  Pt. aware mri has been ordered./fim

## 2016-09-07 ENCOUNTER — Encounter: Payer: Self-pay | Admitting: Neurology

## 2016-09-07 DIAGNOSIS — R569 Unspecified convulsions: Secondary | ICD-10-CM | POA: Diagnosis not present

## 2016-09-07 DIAGNOSIS — G43809 Other migraine, not intractable, without status migrainosus: Secondary | ICD-10-CM | POA: Diagnosis not present

## 2016-09-07 DIAGNOSIS — R200 Anesthesia of skin: Secondary | ICD-10-CM | POA: Diagnosis not present

## 2016-09-07 DIAGNOSIS — R9089 Other abnormal findings on diagnostic imaging of central nervous system: Secondary | ICD-10-CM | POA: Diagnosis not present

## 2016-09-20 ENCOUNTER — Encounter: Payer: Self-pay | Admitting: Neurology

## 2016-09-25 ENCOUNTER — Ambulatory Visit (INDEPENDENT_AMBULATORY_CARE_PROVIDER_SITE_OTHER): Payer: BLUE CROSS/BLUE SHIELD | Admitting: Neurology

## 2016-09-25 ENCOUNTER — Encounter: Payer: Self-pay | Admitting: Neurology

## 2016-09-25 VITALS — BP 124/79 | HR 74 | Wt 124.0 lb

## 2016-09-25 DIAGNOSIS — G43009 Migraine without aura, not intractable, without status migrainosus: Secondary | ICD-10-CM

## 2016-09-25 DIAGNOSIS — R9089 Other abnormal findings on diagnostic imaging of central nervous system: Secondary | ICD-10-CM | POA: Diagnosis not present

## 2016-09-25 DIAGNOSIS — R404 Transient alteration of awareness: Secondary | ICD-10-CM

## 2016-09-25 DIAGNOSIS — R200 Anesthesia of skin: Secondary | ICD-10-CM | POA: Diagnosis not present

## 2016-09-25 MED ORDER — TOPIRAMATE 100 MG PO TABS: 100.0000 mg | ORAL_TABLET | Freq: Every day | ORAL | 5 refills | Status: DC

## 2016-09-25 MED ORDER — SUMATRIPTAN SUCCINATE 100 MG PO TABS: 100.0000 mg | ORAL_TABLET | Freq: Once | ORAL | 11 refills | Status: DC

## 2016-09-25 NOTE — Progress Notes (Signed)
[facility]  PATIENT: Amy Hopkins DOB: 1965/03/11  REFERRING DOCTOR OR PCP:  Finis Bud SOURCE: Patient, notes from Dr. Dory Larsen and Dr. Trula Ore, MRI reports, lab reports, MRI images on CD  _________________________________   HISTORICAL  CHIEF COMPLAINT:  Chief Complaint  Patient presents with  . Abnormal brain MRI    husband- Amy Hopkins, "my right foot/leg goes numb and gets cold and my foot drags, happened 3 times; shooting pains in my wrists and arms; feel shock in the right side of my neck; my words get jumbled sometimes"  . Follow-up    6 month    HISTORY OF PRESENT ILLNESS:  Amy Hopkins is a 50 yo woman with an  abnormal MRI who has had possible seizure activity and also reports several episodes of right leg/foot numbness and weakness and other symptoms.     On 09/07/2016, she had an MRI performed at [facility]. We don't have access to those images but we will try to obtain them. The radiologist reported that there was no significant change in the multiple scattered white matterfoci compared to the 2017 MRI.     Spells:   She has had 3 spells of her right foot dragging and numbness.   She feels the right foot inverts some.  There was an uncomfortable feeling in the leg.   She also noted some tingling with shooting pain in the right arm.   She denies neck pain.     She gets occasional episodes of word jumbling.       Transient alteration of awareness.   She had a seizure in 2011 and also had an episode of altered awareness in August 2017 the first episode in 2011 did have generalized tonic-clonic activity and occurred while on Wellbutrin and tramadol. . The second spell did not have any precipitating factor though it is uncertain whether it represented a seizure as there was no loss of consciousness.     She went to [facility] emergency room and was referred to Dr. Trula Ore.  The EEG was read as abnormal (right parietal and temporal slowing) and she was  started on lamotrigine 100 mg by mouth twice a day. She tolerates it well. She has not had more spells.  She notes that she often is his one of her lamotrigine doses.   Abnormal MRI/Possible MS:  Because of the spell in August 2017, she had an MRI.   It shows white matter changes that have progressed somewhat compared to the MRI dated 2011. She was felt by Dr. Clydene Laming time to have MS and she was prescribed Copaxone but never took it.  She was referred to me as a second opinion..   She has not had any episodes of weakness, clumsiness, vision changes, gait changes persisting for more than a few minutes.  He does get intermittent numbness and tingling in the hands (see below).  We had previously discussed that the MRI changes could represent chronic microvascular ischemic change rather than MS though some of the foci are periventricular.   A lumbar puncture was reportedly negative.  There is no family history of MS.   Migraine:   She has 3 -4 migraines a month, usually improved with  She has a long history of migraine headaches. For the most part, these are common migraine headaches without aura. She will get pounding bilateral pain, photophobia, phonophobia and nausea and sometimes vomiting. Her head will make the pain worse. Imitrex usually helps the pain.   These  are actually occurring less since starting lamotrigine.  CTS:   She had a nerve conduction and EMG study performed. It showed that she had the nerve conduction study showed moderate right and mild left carpal tunnel syndrome and no-shows showed mild cervical and lumbosacral radiculopathies.    Anxiety:  She has some anxiety and is on Lexapro. She has not had cognitive dysfunction. She notes some fatigue and sleepiness. She does sleep well at night with just some snoring but no pauses or gasping her breathing.  REVIEW OF SYSTEMS: Constitutional: No fevers, chills, sweats, or change in appetite Eyes: No visual changes, double vision, eye pain Ear,  nose and throat: No hearing loss, ear pain, nasal congestion, sore throat Cardiovascular: No chest pain, palpitations Respiratory: No shortness of breath at rest or with exertion.   No wheezes GastrointestinaI: No nausea, vomiting, diarrhea, abdominal pain, fecal incontinence Genitourinary: No dysuria, urinary retention or frequency.  No nocturia. Musculoskeletal: No neck pain, back pain Integumentary: No rash, pruritus, skin lesions Neurological: as above Psychiatric: No depression at this time.  No anxiety Endocrine: No palpitations, diaphoresis, change in appetite, change in weigh or increased thirst Hematologic/Lymphatic: No anemia, purpura, petechiae. Allergic/Immunologic: No itchy/runny eyes, nasal congestion, recent allergic reactions, rashes  ALLERGIES: Allergies  Allergen Reactions  . Tramadol Other (See Comments)    HOME MEDICATIONS:  Current Outpatient Prescriptions:  .  acetaminophen (TYLENOL) 500 MG tablet, Take by mouth., Disp: , Rfl:  .  escitalopram (LEXAPRO) 10 MG tablet, Take by mouth., Disp: , Rfl:  .  fluocinonide (LIDEX) 0.05 % external solution, , Disp: , Rfl:  .  ibuprofen (ADVIL,MOTRIN) 200 MG tablet, Take by mouth., Disp: , Rfl:  .  methylPREDNISolone (MEDROL) 4 MG tablet, Take as directed over 6 days, Disp: 21 tablet, Rfl: 0 .  SUMAtriptan (IMITREX) 100 MG tablet, Take 1 tablet (100 mg total) by mouth once., Disp: 10 tablet, Rfl: 11 .  topiramate (TOPAMAX) 100 MG tablet, Take 1 tablet (100 mg total) by mouth at bedtime., Disp: 30 tablet, Rfl: 5  PAST MEDICAL HISTORY: Past Medical History:  Diagnosis Date  . Cancer ([place])   . Headache   . Melanoma ([place])   . Multiple sclerosis ([place])   . Psoriasis   . Seizures ([place])   . Vision abnormalities     PAST SURGICAL HISTORY: Past Surgical History:  Procedure Laterality Date  . MELANOMA EXCISION    . MYOMECTOMY      FAMILY HISTORY: Family History  Problem Relation Age of Onset  . Stroke Mother   .  Breast cancer Mother   . Hypertension Father   . High Cholesterol Father   . Psoriasis Father   . Healthy Brother   . Transient ischemic attack Maternal Grandmother     SOCIAL HISTORY:  Social History   Social History  . Marital status: Married    Spouse name: Amy Hopkins  . Number of children: N/A  . Years of education: N/A   Occupational History  . Not on file.   Social History Main Topics  . Smoking status: Former Research scientist (life sciences)  . Smokeless tobacco: Never Used  . Alcohol use Yes     Comment: occasional  . Drug use: No  . Sexual activity: Not on file   Other Topics Concern  . Not on file   Social History Narrative  . No narrative on file     PHYSICAL EXAM  Vitals:   09/25/16 1511  BP: 124/79  Pulse: 74  Weight: 124  lb (56.2 kg)    Body mass index is 24.22 kg/m.   General: The patient is well-developed and well-nourished and in no acute distress   Neurologic Exam  Mental status: The patient is alert and oriented x 3 at the time of the examination. The patient has apparent normal recent and remote memory, with an apparently normal attention span and concentration ability.   Speech is normal.  Cranial nerves: Extraocular movements are full.  There is good facial  Strength .  Trapezius and sternocleidomastoid strength is normal. No dysarthria is noted.  The tongue is midline, and the patient has symmetric elevation of the soft palate. No obvious hearing deficits are noted.  Motor:  Muscle bulk is normal.   Tone is normal. Strength is  5 / 5 in all 4 extremities.    Gait and station: Station is normal.   Gait is normal. Tandem gait is normal. Romberg is negative.   Reflexes: Deep tendon reflexes are symmetric.   DTRs were 3 and symmetric at the knees and 2+ elsewherer.      DIAGNOSTIC DATA (LABS, IMAGING, TESTING) - I reviewed patient records, labs, notes, testing and imaging myself where available.      ASSESSMENT AND PLAN  Abnormal brain MRI  Transient  alteration of awareness  Migraine without aura and without status migrainosus, not intractable  Leg numbness  1.   Her episodes of leg numbness and dragging (3 episodes) last for several hours to 1 day. This time course is unlikely to be ischemic or due to demyelination.     She just had another MRI of the brain performed which was read by the radiologist as being unchanged. We are trying to get the actual images to compare side-by-side.     The MRI of the cervical spine does show spinal stenosis but no spinal cord compression. I don't think the episodes are related due to the time course.  2.   We have discussed that seizure is possible but not definite and the EEG was just slightly abnormal in a more nonspecific way. She has trouble taking lamotrigine twice a day I will switch her to Topamax as it is more likely to help her migraines  3.    She will return to see me in 6 months.  Call sooner if any new or worsening neurologic symptoms.  If she has more episodes of leg numbness and weakness, consider an MRI of the lumbar spine to better characterize  35 minutes face-to-face evaluation with greater than one half of the time counseling and coordinating care.  Taeya Theall A. Felecia Shelling, MD, PhD 02/24/7260, 0:35 PM Certified in Neurology, Clinical Neurophysiology, Sleep Medicine, Pain Medicine and Neuroimaging  [facility] [address] [place], [place] [address] [phone]

## 2016-09-26 ENCOUNTER — Telehealth: Payer: Self-pay | Admitting: Neurology

## 2016-09-26 NOTE — Telephone Encounter (Signed)
Pt calling to see if Dr Felecia Shelling has received the MRI results from 7-20, pt was told that he wanted to review the results to the MRI from March.  Pt is asking for a call back once the results are available.

## 2016-09-26 NOTE — Telephone Encounter (Signed)
Rn spoke with Dr. Felecia Shelling about the results. The scan is not in canopy for reading. Rn call [facility]. Amy Hopkins stated the CD was put in the mail on 09/25/2016 to be deliver.

## 2016-09-26 NOTE — Telephone Encounter (Signed)
MRI results on Dr. Felecia Shelling desk for review.

## 2016-09-27 ENCOUNTER — Emergency Department (HOSPITAL_COMMUNITY): Payer: BLUE CROSS/BLUE SHIELD

## 2016-09-27 ENCOUNTER — Encounter (HOSPITAL_COMMUNITY): Payer: Self-pay | Admitting: Emergency Medicine

## 2016-09-27 ENCOUNTER — Observation Stay (HOSPITAL_COMMUNITY)
Admission: EM | Admit: 2016-09-27 | Discharge: 2016-09-28 | Disposition: A | Discharge status: Home / Self Care | Payer: BLUE CROSS/BLUE SHIELD | Attending: Internal Medicine | Admitting: Internal Medicine

## 2016-09-27 ENCOUNTER — Telehealth: Payer: Self-pay | Admitting: Neurology

## 2016-09-27 DIAGNOSIS — F329 Major depressive disorder, single episode, unspecified: Secondary | ICD-10-CM | POA: Diagnosis present

## 2016-09-27 DIAGNOSIS — G35 Multiple sclerosis: Secondary | ICD-10-CM | POA: Insufficient documentation

## 2016-09-27 DIAGNOSIS — Z79899 Other long term (current) drug therapy: Secondary | ICD-10-CM | POA: Diagnosis not present

## 2016-09-27 DIAGNOSIS — F32A Depression, unspecified: Secondary | ICD-10-CM | POA: Diagnosis present

## 2016-09-27 DIAGNOSIS — G43909 Migraine, unspecified, not intractable, without status migrainosus: Secondary | ICD-10-CM

## 2016-09-27 DIAGNOSIS — R531 Weakness: Secondary | ICD-10-CM | POA: Diagnosis not present

## 2016-09-27 DIAGNOSIS — Z87891 Personal history of nicotine dependence: Secondary | ICD-10-CM | POA: Insufficient documentation

## 2016-09-27 DIAGNOSIS — R29898 Other symptoms and signs involving the musculoskeletal system: Secondary | ICD-10-CM | POA: Diagnosis not present

## 2016-09-27 DIAGNOSIS — R079 Chest pain, unspecified: Secondary | ICD-10-CM | POA: Diagnosis not present

## 2016-09-27 DIAGNOSIS — M4802 Spinal stenosis, cervical region: Secondary | ICD-10-CM | POA: Diagnosis not present

## 2016-09-27 DIAGNOSIS — M4804 Spinal stenosis, thoracic region: Secondary | ICD-10-CM | POA: Diagnosis not present

## 2016-09-27 LAB — URINALYSIS, ROUTINE W REFLEX MICROSCOPIC
Bilirubin Urine: NEGATIVE
Glucose, UA: NEGATIVE mg/dL
Hgb urine dipstick: NEGATIVE
Ketones, ur: NEGATIVE mg/dL
Leukocytes, UA: NEGATIVE
Nitrite: NEGATIVE
Protein, ur: NEGATIVE mg/dL
Specific Gravity, Urine: 1.014 (ref 1.005–1.030)
pH: 7 (ref 5.0–8.0)

## 2016-09-27 LAB — CBC
HCT: 41.6 % (ref 36.0–46.0)
Hemoglobin: 13.4 g/dL (ref 12.0–15.0)
MCH: 29.7 pg (ref 26.0–34.0)
MCHC: 32.2 g/dL (ref 30.0–36.0)
MCV: 92.2 fL (ref 78.0–100.0)
Platelets: 232 10*3/uL (ref 150–400)
RBC: 4.51 MIL/uL (ref 3.87–5.11)
RDW: 13.1 % (ref 11.5–15.5)
WBC: 7 10*3/uL (ref 4.0–10.5)

## 2016-09-27 LAB — TROPONIN I: Troponin I: <0.03 ng/mL (ref <0.03)

## 2016-09-27 LAB — BASIC METABOLIC PANEL
Anion gap: 7 (ref 5–15)
BUN: 9 mg/dL (ref 6–20)
CO2: 28 mmol/L (ref 22–32)
Calcium: 9.5 mg/dL (ref 8.9–10.3)
Chloride: 104 mmol/L (ref 101–111)
Creatinine, Ser: 0.97 mg/dL (ref 0.44–1.00)
GFR calc Af Amer: >60 mL/min (ref >60)
GFR calc non Af Amer: >60 mL/min (ref >60)
Glucose, Bld: 101 mg/dL — ABNORMAL HIGH (ref 65–99)
Potassium: 3.5 mmol/L (ref 3.5–5.1)
Sodium: 139 mmol/L (ref 135–145)

## 2016-09-27 LAB — I-STAT TROPONIN, ED: Troponin i, poc: 0 ng/mL (ref 0.00–0.08)

## 2016-09-27 LAB — HCG, QUANTITATIVE, PREGNANCY: hCG, Beta Chain, Quant, S: 2 m[IU]/mL (ref <5)

## 2016-09-27 MED ORDER — PROCHLORPERAZINE EDISYLATE 5 MG/ML IJ SOLN
10.0000 mg | Freq: Once | INTRAMUSCULAR | Status: AC
  Administered 2016-09-27: 10 mg via INTRAVENOUS
  Filled 2016-09-27: qty 2

## 2016-09-27 MED ORDER — ACETAMINOPHEN 325 MG PO TABS: 650.0000 mg | ORAL_TABLET | Freq: Four times a day (QID) | ORAL | Status: DC | PRN

## 2016-09-27 MED ORDER — ONDANSETRON HCL 4 MG/2ML IJ SOLN: 4.0000 mg | Freq: Four times a day (QID) | INTRAMUSCULAR | Status: DC | PRN

## 2016-09-27 MED ORDER — ESCITALOPRAM OXALATE 10 MG PO TABS
10.0000 mg | ORAL_TABLET | Freq: Every day | ORAL | Status: DC
  Administered 2016-09-28: 10 mg via ORAL
  Filled 2016-09-27: qty 1

## 2016-09-27 MED ORDER — TOPIRAMATE 100 MG PO TABS
50.0000 mg | ORAL_TABLET | Freq: Every day | ORAL | Status: DC
  Administered 2016-09-28: 50 mg via ORAL
  Filled 2016-09-27: qty 1

## 2016-09-27 MED ORDER — ENOXAPARIN SODIUM 40 MG/0.4ML ~~LOC~~ SOLN
40.0000 mg | SUBCUTANEOUS | Status: DC
  Administered 2016-09-28: 40 mg via SUBCUTANEOUS
  Filled 2016-09-27: qty 0.4

## 2016-09-27 MED ORDER — ZOLPIDEM TARTRATE 5 MG PO TABS: 5.0000 mg | ORAL_TABLET | Freq: Every evening | ORAL | Status: DC | PRN

## 2016-09-27 MED ORDER — ACETAMINOPHEN 500 MG PO TABS
1000.0000 mg | ORAL_TABLET | Freq: Once | ORAL | Status: AC
  Administered 2016-09-27: 1000 mg via ORAL
  Filled 2016-09-27: qty 2

## 2016-09-27 MED ORDER — IBUPROFEN 200 MG PO TABS: 200.0000 mg | ORAL_TABLET | Freq: Four times a day (QID) | ORAL | Status: DC | PRN

## 2016-09-27 MED ORDER — TOPIRAMATE 25 MG PO TABS: 50.0000 mg | ORAL_TABLET | Freq: Every day | ORAL | Status: DC

## 2016-09-27 MED ORDER — SODIUM CHLORIDE 0.9 % IV BOLUS (SEPSIS)
1000.0000 mL | Freq: Once | INTRAVENOUS | Status: AC
  Administered 2016-09-27: 1000 mL via INTRAVENOUS

## 2016-09-27 MED ORDER — DIPHENHYDRAMINE HCL 50 MG/ML IJ SOLN
25.0000 mg | Freq: Once | INTRAMUSCULAR | Status: AC
  Administered 2016-09-27: 25 mg via INTRAVENOUS
  Filled 2016-09-27: qty 1

## 2016-09-27 MED ORDER — ASPIRIN EC 81 MG PO TBEC
81.0000 mg | DELAYED_RELEASE_TABLET | Freq: Every day | ORAL | Status: DC
  Administered 2016-09-28 (×2): 81 mg via ORAL
  Filled 2016-09-27 (×2): qty 1

## 2016-09-27 MED ORDER — FLUOCINONIDE 0.05 % EX SOLN: Freq: Two times a day (BID) | CUTANEOUS | Status: DC

## 2016-09-27 MED ORDER — ONDANSETRON HCL 4 MG PO TABS: 4.0000 mg | ORAL_TABLET | Freq: Four times a day (QID) | ORAL | Status: DC | PRN

## 2016-09-27 MED ORDER — LORAZEPAM 2 MG/ML IJ SOLN
0.5000 mg | Freq: Once | INTRAMUSCULAR | Status: AC | PRN
  Administered 2016-09-27: 0.5 mg via INTRAVENOUS
  Filled 2016-09-27: qty 1

## 2016-09-27 MED ORDER — SUMATRIPTAN SUCCINATE 100 MG PO TABS: 100.0000 mg | ORAL_TABLET | Freq: Two times a day (BID) | ORAL | Status: DC | PRN

## 2016-09-27 NOTE — ED Notes (Signed)
Pts husband taking home all of pts belongings including white metal ring with clear stones, ear ring studs with clear stones, fitness band, and clothing. Pts husband also taking home all pts medications.

## 2016-09-27 NOTE — Telephone Encounter (Signed)
Patient called office in reference to having uncontrollable shakes all over her body starting last night.  Patient went walking this morning with her friend when friend noticed patient was walking slower, sluggish, and her right foot started to drag again.  Patient states she is having left lower back pain also.  Patient's voice was quivering and had to pause to think about what she was going to say next.   Reached out to RN Katrina advising patient to seek ED, patient voiced her understanding and was going to call contact 911 or have her patient bring her to the ED because he is currently with her.  FYI

## 2016-09-27 NOTE — ED Notes (Addendum)
Shawn PA at bedside 

## 2016-09-27 NOTE — ED Notes (Signed)
Got patient into a gown on the monitor patient is waiting on provider with family at bedside with call bell at reach

## 2016-09-27 NOTE — ED Notes (Signed)
Yellow socks and band applied to pt.  

## 2016-09-27 NOTE — ED Notes (Signed)
Patient transported to MRI 

## 2016-09-27 NOTE — Medical Student Note (Signed)
[facility] DEPT Provider Student Note For educational purposes for Medical, PA and NP students only and not part of the legal medical record.   CSN: [id number] Arrival date & time: 09/27/16  1030     History   Chief Complaint Chief Complaint  Patient presents with  . Shaking  . Chest Pain  . Flank Pain    HPI Amy Hopkins is a 51 y.o. female with a past medical history significant for migraines and transient alteration of awareness presents to the ED today with a chief complaint of tremors, spastic gait and slowed speech. She states that this morning around 5:45 a.m. She went on a walk with her friend when he friend noticed that "she just wasn't right." Her speed of walking was decreased and her friend noticed that her speech was slightly slurred. She was driven home by her friend's husband after noting some numbness in her right foot. Once home, she began to experience uncontrolled tremors of her right leg and arm. She promptly called her neurologist and was told to proceed to the emergency department. She admits headache, feeling "off balance," and lightheadedness as well as a slight decrease in her vision over the past couple of months but denies any vision loss, bright flashes or floaters. She denies fever but admits to chills. She denies SOB. Patient endorses some CP and palpitations which she attributes to anxiety. Patient endorses pain in her right lower back that does not travel past her knee.  HPI  Past Medical History:  Diagnosis Date  . Cancer ([place])   . Headache   . Melanoma ([place])   . Multiple sclerosis ([place])   . Psoriasis   . Seizures ([place])   . Vision abnormalities     Patient Active Problem List   Diagnosis Date Noted  . Leg numbness 09/25/2016  . Migraines 03/27/2016  . Abnormal brain MRI 01/25/2016  . Transient alteration of awareness 01/25/2016  . Arm weakness 01/25/2016  . Bursitis of right hip 01/25/2016  . Anxiety state 08/01/2009  . SHOULDER, PAIN  08/01/2009  . ROTATOR CUFF SPRAIN AND STRAIN 08/01/2009  . DERMATITIS 08/03/2008    Past Surgical History:  Procedure Laterality Date  . MELANOMA EXCISION    . MYOMECTOMY      OB History    No data available       Home Medications    Prior to Admission medications   Medication Sig Start Date End Date Taking? Authorizing Provider  acetaminophen (TYLENOL) 500 MG tablet Take by mouth.    [provider]  escitalopram (LEXAPRO) 10 MG tablet Take by mouth. 03/17/13   [provider]  fluocinonide (LIDEX) 0.05 % external solution  07/23/13   [provider]  ibuprofen (ADVIL,MOTRIN) 200 MG tablet Take by mouth.    [provider]  methylPREDNISolone (MEDROL) 4 MG tablet Take as directed over 6 days 01/25/16   Sater, Nanine Means, MD  SUMAtriptan (IMITREX) 100 MG tablet Take 1 tablet (100 mg total) by mouth once. 09/25/16 09/25/16  Sater, Nanine Means, MD  topiramate (TOPAMAX) 100 MG tablet Take 1 tablet (100 mg total) by mouth at bedtime. 09/25/16   Sater, Nanine Means, MD    Family History Family History  Problem Relation Age of Onset  . Stroke Mother   . Breast cancer Mother   . Hypertension Father   . High Cholesterol Father   . Psoriasis Father   . Healthy Brother   . Transient ischemic attack Maternal Grandmother  Social History Social History  Substance Use Topics  . Smoking status: Former Research scientist (life sciences)  . Smokeless tobacco: Never Used  . Alcohol use Yes     Comment: occasional     Allergies   Tramadol   Review of Systems Review of Systems  General: Endorses chills; denies fever, night sweats Head: Admits headache, dizziness and light headedness; denies vertigo  Eyes: Admits a possible decline in her vision but denies diplopia, complete loss of vision, or floaters in her eyes. Mouth/Throat: Denies dysphagia, odynophagia Heart: Admits chest pain and palpitations which she attributes to anxiety   Lungs: Denies cough/SOB PV: Admits  paresthesias in bilateral upper and lower extremities GI: Denies changes in appetite, abdominal pain, constipation or diarrhea, N/V GU: Endorses urgency and occasional incontinence; denies hematuria, dysuria Neuro: Endorses tremors, muscle weakness, slurred speech; denies changes in judgement; denies seizures, loss of consciousness Psych: Endorses anxiety; denies depression, suicidal ideation    Physical Exam Updated Vital Signs BP 120/82   Pulse 72   Temp 98.7 F (37.1 C) (Oral)   Resp 17   Ht 5' (1.524 m)   Wt 56.2 kg   SpO2 97%   BMI 24.22 kg/m   Physical Exam HEENT: PERRLA. Extraocular muscles intact.  Cardio: S1 and S2 distinct without murmurs, gallops or rubs.  Pulmonary: Lungs clear to auscultation bilaterally without crackles, wheezing or rhonchi.  Neuro: CN's 2-12 intact. Point- to-point discrimination intact. Reflexes 3+ in bilateral lower extremities, 2+ in bilateral upper extremities. Strength 5/5 in bilateral upper extremities, 3/5 in bilateral lower extremities. Sensation intact in bilateral upper and lower extremities. Shuffling, spastic, hemiparetic gait noted. Rectal tone diminished.  Musculoskeletal: No gross deformity noted. No point tenderness on palpation. Full ROM in bilateral upper and lower extremities. Refer to neuro for strength testing.   ED Treatments / Results  Labs (all labs ordered are listed, but only abnormal results are displayed) Labs Reviewed  BASIC METABOLIC PANEL - Abnormal; Notable for the following:       Result Value   Glucose, Bld 101 (*)    All other components within normal limits  URINALYSIS, ROUTINE W REFLEX MICROSCOPIC - Abnormal; Notable for the following:    APPearance HAZY (*)    All other components within normal limits  CBC  I-STAT TROPONIN, ED    EKG  EKG Interpretation None       Radiology Dg Chest 2 View  Result Date: 09/27/2016 CLINICAL DATA:  Chest pain EXAM: CHEST  2 VIEW COMPARISON:  None. FINDINGS: Normal  heart size. Normal mediastinal contour. No pneumothorax. No pleural effusion. Lungs appear clear, with no acute consolidative airspace disease and no pulmonary edema. IMPRESSION: No active cardiopulmonary disease. Electronically Signed   By: Ilona Sorrel M.D.   On: 09/27/2016 11:10    Procedures Procedures (including critical care time)  Medications Ordered in ED Medications  sodium chloride 0.9 % bolus 1,000 mL (not administered)  acetaminophen (TYLENOL) tablet 1,000 mg (not administered)     Initial Impression / Assessment and Plan / ED Course  I have reviewed the triage vital signs and the nursing notes.  Pertinent labs & imaging results that were available during my care of the patient were reviewed by me and considered in my medical decision making (see chart for details).  Clinical Course as of Sep 28 1654  Thu Sep 27, 2016  1612 Spoke with Dr. Felecia Shelling, patient's neurologist. Dr. Felecia Shelling expresses concern about possible worsening cervical spinal stenosis. Requests MR brain and entire  spine with and without contrast. Requests initial steroid dosing of 1000mg  of Solu-Medrol IV. Dose was confirmed with read back. Further treatment decisions he defers to neuro hospitalist.  [SJ]    Clinical Course User Index [SJ] Joy, Shawn C, PA-C      Final Clinical Impressions(s) / ED Diagnoses   Final diagnoses:  None    New Prescriptions New Prescriptions   No medications on file

## 2016-09-27 NOTE — Consult Note (Signed)
Neurology Consultation  Reason for Consult: Right leg shaking and weakness Referring Physician: Dr. Felecia Shelling (Outpatient Neurologist), Arlean Hopping, PA-C (EDP)  CC: Right leg shaking, right leg weakness, chest pain, flank pain.  History is obtained from: Patient, husband  HPI: Amy Hopkins is a 51 y.o. female was a past medical history of an abnormal MRI and possible seizures, migraines and episodic right leg and foot numbness and weakness who presented to the emergency room at the behest of her outpatient neurologist who she had contacted for worsening right leg shaking and weakness. She said that she had woken up this morning and when she stood up in the bathroom, her right leg started to shake and since then she has noticed that her right foot is extremely weak and she almost has a foot drop. She has reported worsening of dragging her foot while walking. The symptoms had come and gone in the past but this morning were more severe than before. She was seen by her outpatient neurologist 2 days ago and started on Topamax for headaches and presumed seizure activity. Her first was of Topamax was 2 days ago. No other medication changes. The patient had this episode of right leg shaking captured on her phone camera. She had recorded this episode while standing in her own bathroom and the episode lasted about 5 minutes after which she felt that she had a foot drop. Review of the video shows the patient according her own leg shaking in a very nonepileptic fashion. She also has had a headache that was 10/10 around her head radiating to her back and neck that has been going on today.  No visual symptoms. No difficulty swallowing. No nausea or vomiting. No fevers chills. No weight loss . Endorses severe anxiety and depression with no suicidal ideation or homicidal ideation.  Outpatient neurologist was contacted on the phone from the emergency room with the report that patient has much more weakness in her legs  than baseline, hyperreflexia at the knees and is feeling sick. Outpatient neurologist Dr. Rachel Moulds sent the patient to the emergency room for evaluation of worsening of her known spinal stenosis with a spine MRI and possible IV steroid administration.  ROS: A 14 point ROS was performed and is negative except as noted in the HPI.    Past Medical History:  Diagnosis Date  . Cancer ([place])   . Headache   . Melanoma ([place])   . Multiple sclerosis ([place])   . Psoriasis   . Seizures ([place])   . Vision abnormalities     Family History  Problem Relation Age of Onset  . Stroke Mother   . Breast cancer Mother   . Hypertension Father   . High Cholesterol Father   . Psoriasis Father   . Healthy Brother   . Transient ischemic attack Maternal Grandmother     Social History:   reports that she has quit smoking. She has never used smokeless tobacco. She reports that she drinks alcohol. She reports that she does not use drugs.   Medications  Current Facility-Administered Medications:  .  acetaminophen (TYLENOL) tablet 650 mg, 650 mg, Oral, Q6H PRN, Ivor Costa, MD .  aspirin EC tablet 81 mg, 81 mg, Oral, Daily, Ivor Costa, MD .  enoxaparin (LOVENOX) injection 40 mg, 40 mg, Subcutaneous, Q24H, Niu, Xilin, MD .  escitalopram (LEXAPRO) tablet 10 mg, 10 mg, Oral, QHS, Ivor Costa, MD .  fluocinonide (LIDEX) 0.05 % external solution, , Topical, BID, Ivor Costa, MD .  ibuprofen (ADVIL,MOTRIN) tablet 200 mg, 200 mg, Oral, Q6H PRN, Ivor Costa, MD .  ondansetron (ZOFRAN) tablet 4 mg, 4 mg, Oral, Q6H PRN **OR** ondansetron (ZOFRAN) injection 4 mg, 4 mg, Intravenous, Q6H PRN, Ivor Costa, MD .  SUMAtriptan (IMITREX) tablet 100 mg, 100 mg, Oral, Q2H PRN, Ivor Costa, MD .  topiramate (TOPAMAX) tablet 50 mg, 50 mg, Oral, QHS, Ivor Costa, MD .  zolpidem (AMBIEN) tablet 5 mg, 5 mg, Oral, QHS PRN, Ivor Costa, MD  Current Outpatient Prescriptions:  .  acetaminophen (TYLENOL) 500 MG tablet, Take by mouth., Disp: , Rfl:   .  escitalopram (LEXAPRO) 10 MG tablet, Take by mouth., Disp: , Rfl:  .  fluocinonide (LIDEX) 0.05 % external solution, , Disp: , Rfl:  .  ibuprofen (ADVIL,MOTRIN) 200 MG tablet, Take by mouth., Disp: , Rfl:  .  methylPREDNISolone (MEDROL) 4 MG tablet, Take as directed over 6 days, Disp: 21 tablet, Rfl: 0 .  SUMAtriptan (IMITREX) 100 MG tablet, Take 1 tablet (100 mg total) by mouth once., Disp: 10 tablet, Rfl: 11 .  topiramate (TOPAMAX) 100 MG tablet, Take 1 tablet (100 mg total) by mouth at bedtime., Disp: 30 tablet, Rfl: 5  Exam: Current vital signs: BP 109/76   Pulse 83   Temp 98.3 F (36.8 C)   Resp 13   Ht 5' (1.524 m)   Wt 56.2 kg (124 lb)   SpO2 97%   BMI 24.22 kg/m   Vital signs in last 24 hours: Temp:  [98.3 F (36.8 C)-98.7 F (37.1 C)] 98.3 F (36.8 C) (08/09 1659) Pulse Rate:  [64-83] 83 (08/09 2001) Resp:  [13-24] 13 (08/09 1730) BP: (109-140)/(76-94) 109/76 (08/09 2001) SpO2:  [97 %-100 %] 97 % (08/09 2001) Weight:  [56.2 kg (124 lb)] 56.2 kg (124 lb) (08/09 1047)  GENERAL: Awake, alert in NAD HEENT: - Normocephalic and atraumatic, dry mm, no LN++, no Thyromegally LUNGS - Clear to auscultation bilaterally with no wheezes CV - S1S2 RRR, no m/r/g, equal pulses bilaterally. ABDOMEN - Soft, nontender, nondistended with normoactive BS Ext: warm, well perfused, intact peripheral pulses, no edema  NEURO:  Mental Status: AA&Ox3  Language: speech is clear  Naming, repetition, fluency, and comprehension intact. Cranial Nerves: PERRLA. EOMI, visual fields full, no facial asymmetry, facial sensation intact, hearing intact, tongue/uvula/soft palate midline, normal sternocleidomastoid and trapezius muscle strength. No evidence of tongue atrophy or fibrillations Motor: Bilateral upper extremities 5/5. Bilateral lower extremities Hoover's sign positive, barely antigravity but not trying. Tone: is normal and bulk is normal Sensation- Intact to light touch bilaterally with no  extinction on double simultaneous stimulation Coordination: FTN intact bilaterally Gait- deferred Deep tendon reflexes: 2+ biceps bilaterally, 2+ triceps bilaterally, 3+ at the knees, 2+ at the ankle, 1-2 beats of rawness in bilateral ankles. Downgoing toes bilaterally.   Labs I have reviewed labs in epic and the results pertinent to this consultation are: Unremarkable CBC and BMP  CBC    Component Value Date/Time   WBC 7.0 09/27/2016 1043   RBC 4.51 09/27/2016 1043   HGB 13.4 09/27/2016 1043   HCT 41.6 09/27/2016 1043   PLT 232 09/27/2016 1043   MCV 92.2 09/27/2016 1043   MCH 29.7 09/27/2016 1043   MCHC 32.2 09/27/2016 1043   RDW 13.1 09/27/2016 1043    CMP     Component Value Date/Time   NA 139 09/27/2016 1043   K 3.5 09/27/2016 1043   CL 104 09/27/2016 1043   CO2 28 09/27/2016 1043  GLUCOSE 101 (H) 09/27/2016 1043   BUN 9 09/27/2016 1043   CREATININE 0.97 09/27/2016 1043   CALCIUM 9.5 09/27/2016 1043   GFRNONAA >60 09/27/2016 1043   GFRAA >60 09/27/2016 1043    Diagnostics/Imaging I have reviewed the images/Results obtained:  MRI examination of the brain: No images available. Prior notes from outpatient neurologist reveals nonspecific white matter changes, at one point was thought to have MS but on second opinion changes were considered consistent with chronic white matter disease and not demyelination.  MRI C-spine as reviewed by Dr. Rachel Moulds- with and without contrast done in December 2017 showed mild spinal stenosis at C4-5, C5-6 and C7-T1 with mild to moderate stenosis at C6-7. Right greater than left foraminal narrowing at C3-4 and left greater than right foraminal narrowing at C6-7 and lateral foraminal narrowing at C7-T1. No definitive nerve root compression. No spinal cord compression. No abnormal enhancement.  MRI of the cervical spine and thoracic spine without contrast done today at [facility]. Mild C3-4 spinal canal stenosis, slightly worse compared to  December 2017. No cord compression. Moderate right C6-7 neural foraminal stenosis. Normal thoracic spine MRI.  EEG done in 2011 showed focal right-sided slowing per outpatient neurology notes.  Assessment:  51 year old with a past medical history of an abnormal brain MRI, possible seizures, migraines, episodic right leg and foot numbness and weakness presented to the emergency room for evaluation of worsening right lower extremity weakness and shaking of the right leg. She had a video made on her phone showing her right leg shaking which is suspicious for nonepileptic movements in her right leg. Her neurological exam is extremely blemished with bilateral lower extremity weakness and a positive Hoover's sign. Her cervical and thoracic spine MRIs revealed slight worsening of C6-7 foraminal stenosis with no cord compression. The imaging findings are not consistent with her clinical findings at this time. She has also started Topamax 2 days ago, and could be experiencing some sedating side effects and some weakness because of the medication. A report of abnormal EEG many years ago had focal slowing on the right side, again cannot explain right-sided shaking or weakness.  Impression: Complex migraine Possible affect of new medication-Topamax History of abnormal brain MRI Nonepileptic shaking movements of the right side of the body, predominantly leg. Conversion disorder  Recommendations: Outpatient psychiatry consult/psychotherapy consult Reduce Topamax to 25 mg at bedtime, slowly going up every week by 25 mg to attain a goal dose of 100 mg daily or as advised by outpatient neurologist. Follow-up with outpatient neurology upon discharge. She has an appointment in February, but I would recommend that she sees an outpatient neurologist sooner for follow-up. I don't see a need for any acute neurosurgical intervention at this time. Physical therapy, occupational therapy and speech therapy evaluation  for discharge planning per primary team. I do not have any further neurological workup recommendations at this time.  Please call us with questions as needed.   -- Amie Portland, MD [facility] [phone]  If 7pm to 7am, please call on call as listed on AMION.

## 2016-09-27 NOTE — ED Notes (Signed)
Attempted report x1. 

## 2016-09-27 NOTE — ED Triage Notes (Addendum)
Pt reports 1 month of right leg shaking, was diagnosed with MS by one doctor but got a second opinion and they told her it was not MS. Pt states right leg has continued to shake. Pt then states last night the shaking has transferred to her whole body including her head.  Pt also reports left sided flank pain starting yesterday.   Pt also endorses left sided chest pain for one month that is tightness that comes and goes. 2/10 at present.

## 2016-09-27 NOTE — Telephone Encounter (Signed)
I spoke to [place], [place], who is seeing Amy Hopkins in the emergency room. She is having much more weakness in her legs. She has hyperreflexia at the knees.   When I saw her earlier this week, reflexes were increased but not pathologic in the knees.      She had an MRI of the brain performed a couple weeks ago that was read as unchanged from the previous one. We have requested the images but they have not yet been received.  I discussed with him that I am most concerned about the possibility of her known spinal stenosis worsening. Therefore, she should get IV steroids and have an MRI of the cervical spine. If his spinal stenosis is worse, neurosurgery should be consulted.

## 2016-09-27 NOTE — ED Provider Notes (Signed)
[facility] DEPT Provider Note   CSN: [id number] Arrival date & time: 09/27/16  1030     History   Chief Complaint Chief Complaint  Patient presents with  . Shaking  . Chest Pain  . Flank Pain    HPI Amy Hopkins is a 51 y.o. female.  HPI   Amy Hopkins is a 51 y.o. female, with a history of Headaches, presenting to the ED with worsening weakness and shaking that occurred around 5:45AM this morning. States she was on a walk when she began to feel "swimmy headed." Shortly thereafter, she began to have weakness in her right leg that she refers to as "leg drag." She also notes Shaking in the right leg as well as a pins and needle sensation. Patient then began to have shaking in her right arm with rest as well as with attempts to move her arm. She also notes neck pain and a feeling of weakness in her neck that gave her the sensation that she was having trouble keeping her head upright. Neck weakness seems to have resolved. She notes some minor urinary incontinence over the last month.  She notes a headache that she describes as pounding, 10/10, bilateral, around the circumference of her head and radiates into the back of her neck. Headache seems to be consistent with her previous headaches that she refers to as her migraines. She takes sumatriptan for her headaches, but has not taken this today. She did endorse some chest discomfort that felt like the muscles in her chest were spasming. She currently does not complain of any chest discomfort.   In short, the symptoms that are new for her today are worsening, now bilateral lower extremity weakness as well as new right arm shaking.  Denies shortness of breath, difficulty swallowing, nausea/vomiting, vision abnormalities, fever/chills, falls/trauma, or any other complaints.  Patient saw her neurologist, Dr. Felecia Shelling [facility]) on 09/25/16.   Past Medical History:  Diagnosis Date  . Cancer ([place])   . Headache   . Melanoma ([place])     . Multiple sclerosis ([place])   . Psoriasis   . Seizures ([place])   . Vision abnormalities   There is some disagreement as to whether or not patient actually has MS or has had seizures.  Patient Active Problem List   Diagnosis Date Noted  . Leg weakness 09/27/2016  . Chest pain 09/27/2016  . Depression 09/27/2016  . Migraine 09/27/2016  . Leg numbness 09/25/2016  . Migraines 03/27/2016  . Abnormal brain MRI 01/25/2016  . Transient alteration of awareness 01/25/2016  . Arm weakness 01/25/2016  . Bursitis of right hip 01/25/2016  . Anxiety state 08/01/2009  . SHOULDER, PAIN 08/01/2009  . ROTATOR CUFF SPRAIN AND STRAIN 08/01/2009  . DERMATITIS 08/03/2008    Past Surgical History:  Procedure Laterality Date  . MELANOMA EXCISION    . MYOMECTOMY      OB History    No data available       Home Medications    Prior to Admission medications   Medication Sig Start Date End Date Taking? Authorizing Provider  acetaminophen (TYLENOL) 500 MG tablet Take by mouth.    [provider]  escitalopram (LEXAPRO) 10 MG tablet Take by mouth. 03/17/13   [provider]  fluocinonide (LIDEX) 0.05 % external solution  07/23/13   [provider]  ibuprofen (ADVIL,MOTRIN) 200 MG tablet Take by mouth.    [provider]  methylPREDNISolone (MEDROL) 4 MG tablet Take as directed over 6  days 01/25/16   Sater, Nanine Means, MD  SUMAtriptan (IMITREX) 100 MG tablet Take 1 tablet (100 mg total) by mouth once. 09/25/16 09/25/16  Sater, Nanine Means, MD  topiramate (TOPAMAX) 100 MG tablet Take 1 tablet (100 mg total) by mouth at bedtime. 09/25/16   Sater, Nanine Means, MD    Family History Family History  Problem Relation Age of Onset  . Stroke Mother   . Breast cancer Mother   . Hypertension Father   . High Cholesterol Father   . Psoriasis Father   . Healthy Brother   . Transient ischemic attack Maternal Grandmother     Social History Social History  Substance Use Topics  .  Smoking status: Former Research scientist (life sciences)  . Smokeless tobacco: Never Used  . Alcohol use Yes     Comment: occasional     Allergies   Tramadol   Review of Systems Review of Systems  Constitutional: Negative for chills, diaphoresis and fever.  Respiratory: Negative for shortness of breath.   Gastrointestinal: Negative for abdominal pain, diarrhea, nausea and vomiting.  All other systems reviewed and are negative.    Physical Exam Updated Vital Signs BP 120/82   Pulse 72   Temp 98.7 F (37.1 C) (Oral)   Resp 17   Ht 5' (1.524 m)   Wt 56.2 kg (124 lb)   SpO2 97%   BMI 24.22 kg/m   Physical Exam  Constitutional: She is oriented to person, place, and time. She appears well-developed and well-nourished. No distress.  HENT:  Head: Normocephalic and atraumatic.  Mouth/Throat: Oropharynx is clear and moist.  Eyes: Pupils are equal, round, and reactive to light. Conjunctivae and EOM are normal.  Neck: Normal range of motion. Neck supple.  Cardiovascular: Normal rate, regular rhythm, normal heart sounds and intact distal pulses.   Pulmonary/Chest: Effort normal and breath sounds normal. No respiratory distress.  Abdominal: Soft. There is no tenderness. There is no guarding.  Musculoskeletal: She exhibits no edema.  Lymphadenopathy:    She has no cervical adenopathy.  Neurological: She is alert and oriented to person, place, and time. She displays abnormal reflex.  Reflex Scores:      Tricep reflexes are 2+ on the right side and 2+ on the left side.      Bicep reflexes are 2+ on the right side and 2+ on the left side.      Patellar reflexes are 3+ on the right side and 3+ on the left side. Lower extremities: Flexion and extension at the hips 2/5 Flexion and extension at the knee 3/5 Plantar and dorsiflexion of the feet 3 minus/5  Upper extremities: Grip strengths 4/5 bilaterally Strength in the elbows and shoulders 5/5 bilaterally.  Rectal tone present, but decreased. Patient is  unable to stand on her own. She is able to put weight on her legs, but is very unstable.   Skin: Skin is warm and dry. Capillary refill takes less than 2 seconds. She is not diaphoretic.  Psychiatric: She has a normal mood and affect. Her behavior is normal.  Nursing note and vitals reviewed.    ED Treatments / Results  Labs (all labs ordered are listed, but only abnormal results are displayed) Labs Reviewed  BASIC METABOLIC PANEL - Abnormal; Notable for the following:       Result Value   Glucose, Bld 101 (*)    All other components within normal limits  URINALYSIS, ROUTINE W REFLEX MICROSCOPIC - Abnormal; Notable for the following:  APPearance HAZY (*)    All other components within normal limits  CBC  HEMOGLOBIN A1C  LIPID PANEL  TROPONIN I  TROPONIN I  TROPONIN I  HIV ANTIBODY (ROUTINE TESTING)  BASIC METABOLIC PANEL  CBC  RAPID URINE DRUG SCREEN, HOSP PERFORMED  I-STAT TROPONIN, ED    EKG  EKG Interpretation  Date/Time:  Thursday September 27 2016 10:33:52 EDT Ventricular Rate:  80 PR Interval:  148 QRS Duration: 72 QT Interval:  364 QTC Calculation: 419 R Axis:   89 Text Interpretation:  Normal sinus rhythm Possible Left atrial enlargement Borderline ECG No previous ECGs available Confirmed by Wandra Arthurs [phone]) on 09/27/2016 5:01:24 PM       Radiology Dg Chest 2 View  Result Date: 09/27/2016 CLINICAL DATA:  Chest pain EXAM: CHEST  2 VIEW COMPARISON:  None. FINDINGS: Normal heart size. Normal mediastinal contour. No pneumothorax. No pleural effusion. Lungs appear clear, with no acute consolidative airspace disease and no pulmonary edema. IMPRESSION: No active cardiopulmonary disease. Electronically Signed   By: Ilona Sorrel M.D.   On: 09/27/2016 11:10   Mr Cervical Spine Wo Contrast  Result Date: 09/27/2016 CLINICAL DATA:  Neck pain and bilateral leg weakness EXAM: MRI CERVICAL SPINE WITHOUT CONTRAST MRI THORACIC SPINE WITHOUT CONTRAST TECHNIQUE: Multiplanar  and multiecho pulse sequences of the cervical and thoracic spine were obtained without intravenous contrast. COMPARISON:  Cervical spine MRI 01/27/2016 FINDINGS: MRI CERVICAL SPINE FINDINGS Alignment: Grade 1 anterolisthesis at C7-T1. Vertebrae: No fracture, evidence of discitis, or bone lesion. Cord: Normal signal and morphology. Posterior Fossa, vertebral arteries, paraspinal tissues: Negative. Disc levels: C1-C2: Normal. C2-C3: Normal disc space and facets. No spinal canal or neuroforaminal stenosis. C3-C4: Small disc osteophyte complex results in mild spinal canal stenosis. Mild bilateral foraminal narrowing. C4-C5: Mild bilateral uncovertebral hypertrophy.  No stenosis. C5-C6: Right subarticular disc protrusion narrows the ventral thecal sac. No central spinal canal stenosis or neural foraminal stenosis. C6-C7: Right-sided uncovertebral hypertrophy causes moderate right neural foraminal stenosis. C7-T1: Grade 1 anterolisthesis with pseudo disc bulge but no stenosis. MRI THORACIC SPINE FINDINGS Alignment:  Physiologic. Vertebrae: No fracture, evidence of discitis, or bone lesion. Cord:  Normal signal and morphology. Paraspinal and other soft tissues: Negative. Disc levels: No disc herniation, spinal canal stenosis or neural foraminal stenosis. IMPRESSION: 1. Mild spinal canal stenosis at the C3-C4 level, which has slightly worsened compared to the 01/27/2016 study. No cord compression. 2. Moderate right C6-C7 neural foraminal stenosis. Correlate for right C7 radiculopathy. 3. Normal MRI of the thoracic spine. Electronically Signed   By: Ulyses Jarred M.D.   On: 09/27/2016 19:58   Mr Thoracic Spine Wo Contrast  Result Date: 09/27/2016 CLINICAL DATA:  Neck pain and bilateral leg weakness EXAM: MRI CERVICAL SPINE WITHOUT CONTRAST MRI THORACIC SPINE WITHOUT CONTRAST TECHNIQUE: Multiplanar and multiecho pulse sequences of the cervical and thoracic spine were obtained without intravenous contrast. COMPARISON:   Cervical spine MRI 01/27/2016 FINDINGS: MRI CERVICAL SPINE FINDINGS Alignment: Grade 1 anterolisthesis at C7-T1. Vertebrae: No fracture, evidence of discitis, or bone lesion. Cord: Normal signal and morphology. Posterior Fossa, vertebral arteries, paraspinal tissues: Negative. Disc levels: C1-C2: Normal. C2-C3: Normal disc space and facets. No spinal canal or neuroforaminal stenosis. C3-C4: Small disc osteophyte complex results in mild spinal canal stenosis. Mild bilateral foraminal narrowing. C4-C5: Mild bilateral uncovertebral hypertrophy.  No stenosis. C5-C6: Right subarticular disc protrusion narrows the ventral thecal sac. No central spinal canal stenosis or neural foraminal stenosis. C6-C7: Right-sided uncovertebral hypertrophy causes moderate  right neural foraminal stenosis. C7-T1: Grade 1 anterolisthesis with pseudo disc bulge but no stenosis. MRI THORACIC SPINE FINDINGS Alignment:  Physiologic. Vertebrae: No fracture, evidence of discitis, or bone lesion. Cord:  Normal signal and morphology. Paraspinal and other soft tissues: Negative. Disc levels: No disc herniation, spinal canal stenosis or neural foraminal stenosis. IMPRESSION: 1. Mild spinal canal stenosis at the C3-C4 level, which has slightly worsened compared to the 01/27/2016 study. No cord compression. 2. Moderate right C6-C7 neural foraminal stenosis. Correlate for right C7 radiculopathy. 3. Normal MRI of the thoracic spine. Electronically Signed   By: Ulyses Jarred M.D.   On: 09/27/2016 19:58    Procedures Procedures (including critical care time)  Medications Ordered in ED Medications  topiramate (TOPAMAX) tablet 50 mg (not administered)  acetaminophen (TYLENOL) tablet 650 mg (not administered)  escitalopram (LEXAPRO) tablet 10 mg (not administered)  fluocinonide (LIDEX) 0.05 % external solution (not administered)  ibuprofen (ADVIL,MOTRIN) tablet 200 mg (not administered)  SUMAtriptan (IMITREX) tablet 100 mg (not administered)    enoxaparin (LOVENOX) injection 40 mg (not administered)  ondansetron (ZOFRAN) tablet 4 mg (not administered)    Or  ondansetron (ZOFRAN) injection 4 mg (not administered)  zolpidem (AMBIEN) tablet 5 mg (not administered)  sodium chloride 0.9 % bolus 1,000 mL (1,000 mLs Intravenous New Bag/Given 09/27/16 1657)  acetaminophen (TYLENOL) tablet 1,000 mg (1,000 mg Oral Given 09/27/16 1704)  prochlorperazine (COMPAZINE) injection 10 mg (10 mg Intravenous Given 09/27/16 1730)  diphenhydrAMINE (BENADRYL) injection 25 mg (25 mg Intravenous Given 09/27/16 1728)  LORazepam (ATIVAN) injection 0.5 mg (0.5 mg Intravenous Given 09/27/16 1747)     Initial Impression / Assessment and Plan / ED Course  I have reviewed the triage vital signs and the nursing notes.  Pertinent labs & imaging results that were available during my care of the patient were reviewed by me and considered in my medical decision making (see chart for details).  Clinical Course as of Sep 28 2051  Thu Sep 27, 2016  1612 Spoke with Dr. Felecia Shelling, patient's neurologist. Dr. Felecia Shelling expresses concern about possible worsening cervical spinal stenosis. Requests MR brain and entire spine with and without contrast. Requests initial steroid dosing of 1000mg  of Solu-Medrol IV. Dose was confirmed with read back. Further treatment decisions he defers to neuro hospitalist.  [SJ]  [phone] Spoke with Dr. Leonel Ramsay, neuro hospitalist. States patient needs the requested MRI is emergently to rule out a neurosurgical emergency. He would hold the Solu-Medrol until imaging is done. He highly recommends getting this imaging here in the ED before doing anything else. He recommends waiting to consult neurosurgery until we know if they need to be involved based on the imaging results. He states he will continue consulting on this patient if a neurosurgical emergency does not exist. He states her headache can be treated with a typical headache cocktail, but he would avoid  triptans.   [SJ]  7026 VZCHY with Megan with MRI to alert her as to the emergent nature of the MRI. States there is a stroke rule out that is in front of our patient, but she will get our patient in about 30-40 minutes.   [SJ]  1708 Dr. Darl Householder spoke with Dr. Leonel Ramsay. Dr. Leonel Ramsay specifies that the imaging studies that need to be done in the ED and that should take priority are the MR cervical and thoracic spines without contrast.  [SJ]  1930 Patient states she feels much better. Her headache has resolved.   [SJ]  2009 Spoke  with Dr. Rory Percy, neurohospitalist, to discuss the patient now that MRI results have returned. He states he will come see the patient shortly. He will advise on further treatment once he evaluates her.   [SJ]  2025 Spoke with Dr. Blaine Hamper, hospitalist, who agreed to admit the patient.  [SJ]  2040 Spoke with Dr. Rory Percy after he assessed the patient. Agrees with observation admission. Will coordinate with hospitalist team for any further imaging or treatment. Hold Solu-Medrol.  [SJ]    Clinical Course User Index [SJ] Pansie Guggisberg C, PA-C     Patient presents with acute findings of lower extremity weakness. MRI of the cervical and thoracic spines shows no neurosurgical emergency. Patient admission for further workup.   Findings and plan of care discussed with Shirlyn Goltz, MD.   Vitals:   09/27/16 1047 09/27/16 1209 09/27/16 1335 09/27/16 1431  BP: 125/85 129/89 114/83 (!) 139/94  Pulse: 75 69 76 73  Resp: 18 14 14  (!) 24  Temp: 98.7 F (37.1 C)     TempSrc: Oral     SpO2: 100% 100% 100% 99%  Weight: 56.2 kg (124 lb)     Height: 5' (1.524 m)        Final Clinical Impressions(s) / ED Diagnoses   Final diagnoses:  Weakness of both lower extremities    New Prescriptions New Prescriptions   No medications on file     Layla Maw 09/27/16 2051    Lorayne Bender, PA-C 09/27/16 2053    Drenda Freeze, MD 09/27/16 [phone]

## 2016-09-27 NOTE — ED Notes (Signed)
Pt is able to move all extremities, pt has to think more about moving her left arm and leg but can move it purposefully.

## 2016-09-27 NOTE — H&P (Signed)
History and Physical    Amy Hopkins YHC:[id number] DOB: [birth year] DOA: 09/27/2016  Referring MD/NP/PA:   PCP: Finis Bud, MD   Patient coming from:  The patient is coming from home.  At baseline, pt is independent for most of ADL.    Chief Complaint: Right leg weakness and shaking and chest pain  HPI: Amy Hopkins is a 51 y.o. female with medical history significant of depression, psoriasis, seizure, melanoma, migraine headache, C-spine stenosis, who presents with right leg weakness and shaking, and chest pain.  Patient states that she has been having right leg weakness and shaking for more than one month. Chart review showed that she had an MRI, which showed shows white matter changes that have progressed somewhat compared to the MRI dated 2011. She was seen by Dr. Clydene Laming and started her with Copaxone for possible MS, but never took it. She was given referral to Dr. Felecia Shelling for second opinion, who did not think pt had MS.   Pt states that she continues to have right leg shaking and weakness. Pt states last night the shaking has transferred to her whole body including her head. No vision changes, hearing loss, numbness in extremities, no facial droop. Patient also reports intermittent mild chest pain, which is located in the frontal chest, 2 or 3 out of 10 in severity, nonradiating. No tenderness in the calf areas. Intermittent chest pain has been going on for about 1 month. Currently no chest pain, SOB, cough, fever or chills. Patient denies GI symptoms, no symptoms of UTI.  ED Course: pt was found to have negative troponin, WBC 7.0, negative urinalysis, electrolytes renal function okay, temperature normal, no tachycardia, oxygen saturation 97% on room air, negative chest x-ray, MRI of the C-spine showed spinal stenosis C3-C4 and C6-C7 without cord compression. MRI of T spin negative. Patient is placed on telemetry bed for observation. Neurology, Dr. Rory Percy was consulted.  Review of  Systems:   General: no fevers, chills, no body weight gain, has fatigue HEENT: no blurry vision, hearing changes or sore throat Respiratory: no dyspnea, coughing, wheezing CV: has chest pain, no palpitations GI: no nausea, vomiting, abdominal pain, diarrhea, constipation GU: no dysuria, burning on urination, increased urinary frequency, hematuria  Ext: no leg edema Neuro: no unilateral weakness, numbness, or tingling, no vision change or hearing loss. Has right leg shaking and weakness. Skin: no rash, no skin tear. MSK: No muscle spasm, no deformity, no limitation of range of movement in spin Heme: No easy bruising.  Travel history: No recent long distant travel.  Allergy:  Allergies  Allergen Reactions  . Tramadol Other (See Comments)    Past Medical History:  Diagnosis Date  . Cancer ([place])   . Headache   . Melanoma ([place])   . Multiple sclerosis ([place])   . Psoriasis   . Seizures ([place])   . Vision abnormalities     Past Surgical History:  Procedure Laterality Date  . MELANOMA EXCISION    . MYOMECTOMY      Social History:  reports that she has quit smoking. She has never used smokeless tobacco. She reports that she drinks alcohol. She reports that she does not use drugs.  Family History:  Family History  Problem Relation Age of Onset  . Stroke Mother   . Breast cancer Mother   . Hypertension Father   . High Cholesterol Father   . Psoriasis Father   . Healthy Brother   . Transient ischemic attack Maternal Grandmother  Prior to Admission medications   Medication Sig Start Date End Date Taking? Authorizing Provider  acetaminophen (TYLENOL) 500 MG tablet Take by mouth.    [provider]  escitalopram (LEXAPRO) 10 MG tablet Take by mouth. 03/17/13   [provider]  fluocinonide (LIDEX) 0.05 % external solution  07/23/13   [provider]  ibuprofen (ADVIL,MOTRIN) 200 MG tablet Take by mouth.    [provider]  methylPREDNISolone  (MEDROL) 4 MG tablet Take as directed over 6 days 01/25/16   Sater, Nanine Means, MD  SUMAtriptan (IMITREX) 100 MG tablet Take 1 tablet (100 mg total) by mouth once. 09/25/16 09/25/16  Sater, Nanine Means, MD  topiramate (TOPAMAX) 100 MG tablet Take 1 tablet (100 mg total) by mouth at bedtime. 09/25/16   Britt Bottom, MD    Physical Exam: Vitals:   09/27/16 1659 09/27/16 1659 09/27/16 1730 09/27/16 2001  BP:   (!) 140/94 109/76  Pulse:   64 83  Resp:   13   Temp: 98.3 F (36.8 C) 98.3 F (36.8 C)    TempSrc:      SpO2:   100% 97%  Weight:      Height:       General: Not in acute distress HEENT:       Eyes: PERRL, EOMI, no scleral icterus.       ENT: No discharge from the ears and nose, no pharynx injection, no tonsillar enlargement.        Neck: No JVD, no bruit, no mass felt. Heme: No neck lymph node enlargement. Cardiac: S1/S2, RRR, No murmurs, No gallops or rubs. Respiratory: No rales, wheezing, rhonchi or rubs. GI: Soft, nondistended, nontender, no rebound pain, no organomegaly, BS present. GU: No hematuria Ext: No pitting leg edema bilaterally. 2+DP/PT pulse bilaterally. Musculoskeletal: No joint deformities, No joint redness or warmth, no limitation of ROM in spin. Skin: No rashes.  Neuro: Alert, oriented X3, cranial nerves II-XII grossly intact. When exam muscle strength, she seems to have poor effort, with muscle strength 4/5 in all extremities, sensation to light touch intact. Knee reflex 2+ bilaterally. Negative Babinski's sign.  Psych: Patient is not psychotic, no suicidal or hemocidal ideation.  Labs on Admission: I have personally reviewed following labs and imaging studies  CBC:  Recent Labs Lab 09/27/16 1043  WBC 7.0  HGB 13.4  HCT 41.6  MCV 92.2  PLT 824   Basic Metabolic Panel:  Recent Labs Lab 09/27/16 1043  NA 139  K 3.5  CL 104  CO2 28  GLUCOSE 101*  BUN 9  CREATININE 0.97  CALCIUM 9.5   GFR: Estimated Creatinine Clearance: 55.2 mL/min (by C-G  formula based on SCr of 0.97 mg/dL). Liver Function Tests: No results for input(s): AST, ALT, ALKPHOS, BILITOT, PROT, ALBUMIN in the last 168 hours. No results for input(s): LIPASE, AMYLASE in the last 168 hours. No results for input(s): AMMONIA in the last 168 hours. Coagulation Profile: No results for input(s): INR, PROTIME in the last 168 hours. Cardiac Enzymes: No results for input(s): CKTOTAL, CKMB, CKMBINDEX, TROPONINI in the last 168 hours. BNP (last 3 results) No results for input(s): PROBNP in the last 8760 hours. HbA1C: No results for input(s): HGBA1C in the last 72 hours. CBG: No results for input(s): GLUCAP in the last 168 hours. Lipid Profile: No results for input(s): CHOL, HDL, LDLCALC, TRIG, CHOLHDL, LDLDIRECT in the last 72 hours. Thyroid Function Tests: No results for input(s): TSH, T4TOTAL, FREET4, T3FREE, THYROIDAB in  the last 72 hours. Anemia Panel: No results for input(s): VITAMINB12, FOLATE, FERRITIN, TIBC, IRON, RETICCTPCT in the last 72 hours. Urine analysis:    Component Value Date/Time   COLORURINE YELLOW 09/27/2016 1048   APPEARANCEUR HAZY (A) 09/27/2016 1048   LABSPEC 1.014 09/27/2016 1048   PHURINE 7.0 09/27/2016 1048   GLUCOSEU NEGATIVE 09/27/2016 1048   HGBUR NEGATIVE 09/27/2016 1048   BILIRUBINUR NEGATIVE 09/27/2016 1048   KETONESUR NEGATIVE 09/27/2016 1048   PROTEINUR NEGATIVE 09/27/2016 1048   NITRITE NEGATIVE 09/27/2016 1048   LEUKOCYTESUR NEGATIVE 09/27/2016 1048   Sepsis Labs: @LABRCNTIP (procalcitonin:4,lacticidven:4) )No results found for this or any previous visit (from the past 240 hour(s)).   Radiological Exams on Admission: Dg Chest 2 View  Result Date: 09/27/2016 CLINICAL DATA:  Chest pain EXAM: CHEST  2 VIEW COMPARISON:  None. FINDINGS: Normal heart size. Normal mediastinal contour. No pneumothorax. No pleural effusion. Lungs appear clear, with no acute consolidative airspace disease and no pulmonary edema. IMPRESSION: No active  cardiopulmonary disease. Electronically Signed   By: Ilona Sorrel M.D.   On: 09/27/2016 11:10   Mr Cervical Spine Wo Contrast  Result Date: 09/27/2016 CLINICAL DATA:  Neck pain and bilateral leg weakness EXAM: MRI CERVICAL SPINE WITHOUT CONTRAST MRI THORACIC SPINE WITHOUT CONTRAST TECHNIQUE: Multiplanar and multiecho pulse sequences of the cervical and thoracic spine were obtained without intravenous contrast. COMPARISON:  Cervical spine MRI 01/27/2016 FINDINGS: MRI CERVICAL SPINE FINDINGS Alignment: Grade 1 anterolisthesis at C7-T1. Vertebrae: No fracture, evidence of discitis, or bone lesion. Cord: Normal signal and morphology. Posterior Fossa, vertebral arteries, paraspinal tissues: Negative. Disc levels: C1-C2: Normal. C2-C3: Normal disc space and facets. No spinal canal or neuroforaminal stenosis. C3-C4: Small disc osteophyte complex results in mild spinal canal stenosis. Mild bilateral foraminal narrowing. C4-C5: Mild bilateral uncovertebral hypertrophy.  No stenosis. C5-C6: Right subarticular disc protrusion narrows the ventral thecal sac. No central spinal canal stenosis or neural foraminal stenosis. C6-C7: Right-sided uncovertebral hypertrophy causes moderate right neural foraminal stenosis. C7-T1: Grade 1 anterolisthesis with pseudo disc bulge but no stenosis. MRI THORACIC SPINE FINDINGS Alignment:  Physiologic. Vertebrae: No fracture, evidence of discitis, or bone lesion. Cord:  Normal signal and morphology. Paraspinal and other soft tissues: Negative. Disc levels: No disc herniation, spinal canal stenosis or neural foraminal stenosis. IMPRESSION: 1. Mild spinal canal stenosis at the C3-C4 level, which has slightly worsened compared to the 01/27/2016 study. No cord compression. 2. Moderate right C6-C7 neural foraminal stenosis. Correlate for right C7 radiculopathy. 3. Normal MRI of the thoracic spine. Electronically Signed   By: Ulyses Jarred M.D.   On: 09/27/2016 19:58   Mr Thoracic Spine Wo  Contrast  Result Date: 09/27/2016 CLINICAL DATA:  Neck pain and bilateral leg weakness EXAM: MRI CERVICAL SPINE WITHOUT CONTRAST MRI THORACIC SPINE WITHOUT CONTRAST TECHNIQUE: Multiplanar and multiecho pulse sequences of the cervical and thoracic spine were obtained without intravenous contrast. COMPARISON:  Cervical spine MRI 01/27/2016 FINDINGS: MRI CERVICAL SPINE FINDINGS Alignment: Grade 1 anterolisthesis at C7-T1. Vertebrae: No fracture, evidence of discitis, or bone lesion. Cord: Normal signal and morphology. Posterior Fossa, vertebral arteries, paraspinal tissues: Negative. Disc levels: C1-C2: Normal. C2-C3: Normal disc space and facets. No spinal canal or neuroforaminal stenosis. C3-C4: Small disc osteophyte complex results in mild spinal canal stenosis. Mild bilateral foraminal narrowing. C4-C5: Mild bilateral uncovertebral hypertrophy.  No stenosis. C5-C6: Right subarticular disc protrusion narrows the ventral thecal sac. No central spinal canal stenosis or neural foraminal stenosis. C6-C7: Right-sided uncovertebral hypertrophy causes moderate right neural  foraminal stenosis. C7-T1: Grade 1 anterolisthesis with pseudo disc bulge but no stenosis. MRI THORACIC SPINE FINDINGS Alignment:  Physiologic. Vertebrae: No fracture, evidence of discitis, or bone lesion. Cord:  Normal signal and morphology. Paraspinal and other soft tissues: Negative. Disc levels: No disc herniation, spinal canal stenosis or neural foraminal stenosis. IMPRESSION: 1. Mild spinal canal stenosis at the C3-C4 level, which has slightly worsened compared to the 01/27/2016 study. No cord compression. 2. Moderate right C6-C7 neural foraminal stenosis. Correlate for right C7 radiculopathy. 3. Normal MRI of the thoracic spine. Electronically Signed   By: Ulyses Jarred M.D.   On: 09/27/2016 19:58     EKG: Independently reviewed.  Sinus rhythm, QTC 419, low voltage, anteroseptal infarction pattern.    Assessment/Plan Principal Problem:    Leg weakness Active Problems:   Chest pain   Depression   Migraine   Leg weakness and shaking: Etiology is not clear. MRI of the C-spine showed spinal stenosis, but no cord compression, this does not explain the right leg symptoms. MRI of T-spine is negative. Neurology Dr. Rory Percy was consulted-->pt has no MS. HE recommended outpatient psychiatry consultation  -will place on tele bed for obs -PT/OT -Per Dr. Rory Percy, will reduce Topamax to 25 mg at bedtime, slowly going up every week by 25 mg to attain a goal dose of 100 mg daily or as advised by outpatient neurologist.  Mild intermittent chest pain: Patient has very atypical chest pain. Currently chest pain-free. Patient has low risk factors for ACS. No history of hypertension, hyperlipidemia and diabetes mellitus. - cycle CE q6 x3 and repeat EKG in the am  - aspirin - Risk factor stratification: will check FLP, UDS and A1C   Depression: Stable, no suicidal or homicidal ideations. -Continue home medications: Lexapro  Migraine: Currently no headache. -continue Topamax, reduced dose from 100 to 50 mg daily -When necessary sumatriptan   DVT ppx: sQ Lovenox Code Status: Full code Family Communication: Yes, patient's  Husband at bed side Disposition Plan:  Anticipate discharge back to previous home environment Consults called: Neurology Dr. Rory Percy was consulted. Admission status: Obs / tele    Date of Service 09/27/2016    Ivor Costa [facility] Pager [phone]  If 7PM-7AM, please contact night-coverage [email or web address] Password [email or web address] 09/27/2016, 9:14 PM

## 2016-09-27 NOTE — Telephone Encounter (Signed)
Dr. Felecia Shelling this is FYI thanks.

## 2016-09-28 ENCOUNTER — Telehealth: Payer: Self-pay | Admitting: Neurology

## 2016-09-28 DIAGNOSIS — R29898 Other symptoms and signs involving the musculoskeletal system: Secondary | ICD-10-CM

## 2016-09-28 LAB — LIPID PANEL
Cholesterol: 184 mg/dL (ref 0–200)
HDL: 58 mg/dL (ref >40)
LDL Cholesterol: 119 mg/dL — ABNORMAL HIGH (ref 0–99)
Total CHOL/HDL Ratio: 3.2 RATIO
Triglycerides: 36 mg/dL (ref <150)
VLDL: 7 mg/dL (ref 0–40)

## 2016-09-28 LAB — BASIC METABOLIC PANEL
Anion gap: 7 (ref 5–15)
BUN: 9 mg/dL (ref 6–20)
CO2: 24 mmol/L (ref 22–32)
Calcium: 9.1 mg/dL (ref 8.9–10.3)
Chloride: 109 mmol/L (ref 101–111)
Creatinine, Ser: 0.87 mg/dL (ref 0.44–1.00)
GFR calc Af Amer: >60 mL/min (ref >60)
GFR calc non Af Amer: >60 mL/min (ref >60)
Glucose, Bld: 128 mg/dL — ABNORMAL HIGH (ref 65–99)
Potassium: 4.1 mmol/L (ref 3.5–5.1)
Sodium: 140 mmol/L (ref 135–145)

## 2016-09-28 LAB — RAPID URINE DRUG SCREEN, HOSP PERFORMED
Amphetamines: NOT DETECTED
Barbiturates: NOT DETECTED
Benzodiazepines: NOT DETECTED
Cocaine: NOT DETECTED
Opiates: NOT DETECTED
Tetrahydrocannabinol: NOT DETECTED

## 2016-09-28 LAB — CBC
HCT: 40.1 % (ref 36.0–46.0)
Hemoglobin: 13.3 g/dL (ref 12.0–15.0)
MCH: 30 pg (ref 26.0–34.0)
MCHC: 33.2 g/dL (ref 30.0–36.0)
MCV: 90.3 fL (ref 78.0–100.0)
Platelets: 213 10*3/uL (ref 150–400)
RBC: 4.44 MIL/uL (ref 3.87–5.11)
RDW: 12.6 % (ref 11.5–15.5)
WBC: 6 10*3/uL (ref 4.0–10.5)

## 2016-09-28 LAB — TROPONIN I
Troponin I: <0.03 ng/mL (ref <0.03)
Troponin I: <0.03 ng/mL (ref <0.03)

## 2016-09-28 LAB — HIV ANTIBODY (ROUTINE TESTING W REFLEX): HIV Screen 4th Generation wRfx: NONREACTIVE

## 2016-09-28 LAB — HEMOGLOBIN A1C
Hgb A1c MFr Bld: 5.1 % (ref 4.8–5.6)
Mean Plasma Glucose: 99.67 mg/dL

## 2016-09-28 MED ORDER — TOPIRAMATE 25 MG PO TABS: 25.0000 mg | ORAL_TABLET | Freq: Every day | ORAL | 0 refills | Status: DC

## 2016-09-28 MED ORDER — SUMATRIPTAN SUCCINATE 100 MG PO TABS
100.0000 mg | ORAL_TABLET | Freq: Two times a day (BID) | ORAL | Status: DC | PRN
Start: 2016-09-28 — End: 2016-09-28

## 2016-09-28 MED ORDER — TOPIRAMATE 25 MG PO TABS
25.0000 mg | ORAL_TABLET | Freq: Every day | ORAL | Status: DC
  Filled 2016-09-28: qty 1

## 2016-09-28 MED ORDER — IBUPROFEN 200 MG PO TABS: 200.0000 mg | ORAL_TABLET | Freq: Four times a day (QID) | ORAL | Status: DC | PRN

## 2016-09-28 NOTE — Progress Notes (Signed)
OT Cancellation Note  Patient Details Name: Amy Hopkins MRN: [id number] DOB: [birth year]   Cancelled Treatment:    Reason Eval/Treat Not Completed: OT screened, no needs identified, will sign off; spoke with PT, Pt doing very well with both mobility and ADLs, no acute OT needs identified at this time. Will sign off.  Lou Cal, OT Pager [phone] 09/28/2016   Raymondo Band 09/28/2016, 4:54 PM

## 2016-09-28 NOTE — Discharge Summary (Signed)
Physician Discharge Summary  Amy Hopkins NWG:[id number] DOB: Dec 28, 1965  PCP: Finis Bud, MD  Admit date: 09/27/2016 Discharge date: 09/28/2016  Recommendations for Outpatient Follow-up:  1. Dr. Finis Bud, PCP in 5 days. 2. Dr. Arlice Colt, Neurology in 1 week.  Home Health: None Equipment/Devices: None    Discharge Condition: Improved and stable  CODE STATUS: Full  Diet recommendation: Heart healthy diet.  Discharge Diagnoses:  Principal Problem:   Leg weakness Active Problems:   Chest pain   Depression   Migraine   Brief Summary: 51 year old female with PMH of an abnormal MRI, possible seizures, migraine and episodic right leg and foot numbness and weakness and depression who presented to the ED as per recommendations by her outpatient neurologist who she had contacted for worsening right leg shaking and weakness. As per inpatient neurology consultation, she stated that she had woken up on the morning of admission and when she stood up in the bathroom, her right leg started to shake and since then she noted that her right foot was extremely weak and she almost had a foot drop. She reported worsening of dragging her right foot while walking. The symptoms had come and gone in the past but seemed more severe than before. She had been seen by her outpatient neurologist 2 days ago and started on Topamax for headaches and presumed seizure activity. Her first dose of Topamax was 2 days ago. No other medication changes were noted. She had recorded the episode of her right leg shaking on her phone camera which neurology reviewed and indicated was very nonepileptic in nature. She also reported headache but no visual symptoms, swallowing difficulties, nausea, vomiting, fever, chills or weight loss. She reported anxiety and depression but no suicidal or homicidal ideations.  Assessment and plan:  1. Right lower extremity weakness and nonepileptic movements/? Conversion  disorder: Neurology evaluated patient in ED. They indicated that this could be a possible effect of new medication i.e. Topamax and recommended reducing Topamax dose to 25 mg at bedtime and slowly increasing every week by 25 mg to attain a goal of 100 mg daily or as advised by outpatient neurology. At this time, Topamax is reduced to 25 mg daily until she follows up with her outpatient neurologist. Neuro hospitalist also recommended early outpatient neurology follow-up and did not see need for any acute neurosurgical intervention at this time. Symptoms have resolved without recurrence. PT and OT do not see any home needs. Neurology also recommended outpatient psychiatric/psychotherapy consultation for further evaluation and management which can be achieved through her PCP. 2. Headaches/complex migraine: Resolved. 3. History of abnormal brain MRI: Outpatient follow-up with neurology. 4. Atypical chest pain: Patient reports having left upper chest pain for the last 1 month, intermittent, only when she gets anxious or stressed, mild, nonradiating, not associated with dyspnea or diaphoresis, resolves spontaneously after she calms down. She denies any lower extremity swelling or pain. Chest pain resolved without recurrence. EKG shows sinus rhythm without acute changes. Troponin 3 negative. Outpatient follow-up with PCP. 5. Anxiety and depression: No suicidal or homicidal ideations. Continue home medications and recommend outpatient psychiatry/psychology consultation.    Consultations:  Neurology  Procedures:  None   Discharge Instructions  Discharge Instructions    Call MD for:    Complete by:  As directed    Strokelike symptoms.   Diet - low sodium heart healthy    Complete by:  As directed    Increase activity slowly    Complete by:  As directed        Medication List    STOP taking these medications   fluocinonide 0.05 % external solution Commonly known as:  LIDEX    methylPREDNISolone 4 MG tablet Commonly known as:  MEDROL     TAKE these medications   acetaminophen 500 MG tablet Commonly known as:  TYLENOL Take 1,000 mg by mouth every 6 (six) hours as needed for mild pain.   aspirin 81 MG chewable tablet Chew 81 mg by mouth daily.   escitalopram 10 MG tablet Commonly known as:  LEXAPRO Take 10 mg by mouth daily.   ibuprofen 200 MG tablet Commonly known as:  ADVIL,MOTRIN Take 200 mg by mouth every 6 (six) hours as needed for mild pain.   lamoTRIgine 100 MG tablet Commonly known as:  LAMICTAL Take 100 mg by mouth 2 (two) times daily.   SUMAtriptan 100 MG tablet Commonly known as:  IMITREX Take 1 tablet (100 mg total) by mouth once. What changed:  when to take this   topiramate 25 MG tablet Commonly known as:  TOPAMAX Take 1 tablet (25 mg total) by mouth at bedtime. What changed:  medication strength  how much to take      Follow-up Information    Swaney, Idelle Leech, MD. Schedule an appointment as soon as possible for a visit in 5 day(s).   Specialty:  Family Medicine Contact information: [address] [place] [address] [phone]        Britt Bottom, MD. Schedule an appointment as soon as possible for a visit in 1 week(s).   Specialty:  Neurology Contact information: [address] [place] [address] [phone]          Allergies  Allergen Reactions  . Tramadol Other (See Comments)      Procedures/Studies: Dg Chest 2 View  Result Date: 09/27/2016 CLINICAL DATA:  Chest pain EXAM: CHEST  2 VIEW COMPARISON:  None. FINDINGS: Normal heart size. Normal mediastinal contour. No pneumothorax. No pleural effusion. Lungs appear clear, with no acute consolidative airspace disease and no pulmonary edema. IMPRESSION: No active cardiopulmonary disease. Electronically Signed   By: Ilona Sorrel M.D.   On: 09/27/2016 11:10   Mr Cervical Spine Wo Contrast  Result Date: 09/27/2016 CLINICAL DATA:   Neck pain and bilateral leg weakness EXAM: MRI CERVICAL SPINE WITHOUT CONTRAST MRI THORACIC SPINE WITHOUT CONTRAST TECHNIQUE: Multiplanar and multiecho pulse sequences of the cervical and thoracic spine were obtained without intravenous contrast. COMPARISON:  Cervical spine MRI 01/27/2016 FINDINGS: MRI CERVICAL SPINE FINDINGS Alignment: Grade 1 anterolisthesis at C7-T1. Vertebrae: No fracture, evidence of discitis, or bone lesion. Cord: Normal signal and morphology. Posterior Fossa, vertebral arteries, paraspinal tissues: Negative. Disc levels: C1-C2: Normal. C2-C3: Normal disc space and facets. No spinal canal or neuroforaminal stenosis. C3-C4: Small disc osteophyte complex results in mild spinal canal stenosis. Mild bilateral foraminal narrowing. C4-C5: Mild bilateral uncovertebral hypertrophy.  No stenosis. C5-C6: Right subarticular disc protrusion narrows the ventral thecal sac. No central spinal canal stenosis or neural foraminal stenosis. C6-C7: Right-sided uncovertebral hypertrophy causes moderate right neural foraminal stenosis. C7-T1: Grade 1 anterolisthesis with pseudo disc bulge but no stenosis. MRI THORACIC SPINE FINDINGS Alignment:  Physiologic. Vertebrae: No fracture, evidence of discitis, or bone lesion. Cord:  Normal signal and morphology. Paraspinal and other soft tissues: Negative. Disc levels: No disc herniation, spinal canal stenosis or neural foraminal stenosis. IMPRESSION: 1. Mild spinal canal stenosis at the C3-C4 level, which has slightly worsened  compared to the 01/27/2016 study. No cord compression. 2. Moderate right C6-C7 neural foraminal stenosis. Correlate for right C7 radiculopathy. 3. Normal MRI of the thoracic spine. Electronically Signed   By: Ulyses Jarred M.D.   On: 09/27/2016 19:58   Mr Thoracic Spine Wo Contrast  Result Date: 09/27/2016 CLINICAL DATA:  Neck pain and bilateral leg weakness EXAM: MRI CERVICAL SPINE WITHOUT CONTRAST MRI THORACIC SPINE WITHOUT CONTRAST TECHNIQUE:  Multiplanar and multiecho pulse sequences of the cervical and thoracic spine were obtained without intravenous contrast. COMPARISON:  Cervical spine MRI 01/27/2016 FINDINGS: MRI CERVICAL SPINE FINDINGS Alignment: Grade 1 anterolisthesis at C7-T1. Vertebrae: No fracture, evidence of discitis, or bone lesion. Cord: Normal signal and morphology. Posterior Fossa, vertebral arteries, paraspinal tissues: Negative. Disc levels: C1-C2: Normal. C2-C3: Normal disc space and facets. No spinal canal or neuroforaminal stenosis. C3-C4: Small disc osteophyte complex results in mild spinal canal stenosis. Mild bilateral foraminal narrowing. C4-C5: Mild bilateral uncovertebral hypertrophy.  No stenosis. C5-C6: Right subarticular disc protrusion narrows the ventral thecal sac. No central spinal canal stenosis or neural foraminal stenosis. C6-C7: Right-sided uncovertebral hypertrophy causes moderate right neural foraminal stenosis. C7-T1: Grade 1 anterolisthesis with pseudo disc bulge but no stenosis. MRI THORACIC SPINE FINDINGS Alignment:  Physiologic. Vertebrae: No fracture, evidence of discitis, or bone lesion. Cord:  Normal signal and morphology. Paraspinal and other soft tissues: Negative. Disc levels: No disc herniation, spinal canal stenosis or neural foraminal stenosis. IMPRESSION: 1. Mild spinal canal stenosis at the C3-C4 level, which has slightly worsened compared to the 01/27/2016 study. No cord compression. 2. Moderate right C6-C7 neural foraminal stenosis. Correlate for right C7 radiculopathy. 3. Normal MRI of the thoracic spine. Electronically Signed   By: Ulyses Jarred M.D.   On: 09/27/2016 19:58      Subjective: Patient interviewed and examined in the presence of a female RN in room. Patient states that she feels "fine". No further right lower extremity weakness or shaking. No chest pain since ED. Denies any other complaints. Eager to go home.  Discharge Exam:  Vitals:   09/27/16 2001 09/28/16 0004 09/28/16  0441 09/28/16 0938  BP: 109/76 112/74 118/80 102/66  Pulse: 83 77 87 87  Resp:  16 16 18   Temp:  97.8 F (36.6 C) 98.4 F (36.9 C) 98.7 F (37.1 C)  TempSrc:  Oral Oral Oral  SpO2: 97% 99% 97% 98%  Weight:  56.7 kg (125 lb 1.6 oz)    Height:        General: Pleasant young female, moderately built and nourished, lying comfortably propped up in bed. Cardiovascular: S1 & S2 heard, RRR, S1/S2 +. No murmurs, rubs, gallops or clicks. No JVD or pedal edema. Telemetry: Sinus rhythm. Respiratory: Clear to auscultation without wheezing, rhonchi or crackles. No increased work of breathing. Abdominal:  Non distended, non tender & soft. No organomegaly or masses appreciated. Normal bowel sounds heard. CNS: Alert and oriented. No focal deficits. Extremities: no edema, no cyanosis    The results of significant diagnostics from this hospitalization (including imaging, microbiology, ancillary and laboratory) are listed below for reference.      Labs: CBC:  Recent Labs Lab 09/27/16 1043 09/28/16 0919  WBC 7.0 6.0  HGB 13.4 13.3  HCT 41.6 40.1  MCV 92.2 90.3  PLT 232 188   Basic Metabolic Panel:  Recent Labs Lab 09/27/16 1043 09/28/16 0919  NA 139 140  K 3.5 4.1  CL 104 109  CO2 28 24  GLUCOSE 101* 128*  BUN  9 9  CREATININE 0.97 0.87  CALCIUM 9.5 9.1   Cardiac Enzymes:  Recent Labs Lab 09/27/16 2248 09/28/16 0355 09/28/16 0919  TROPONINI <0.03 <0.03 <0.03   Hgb A1c  Recent Labs  09/28/16 0355  HGBA1C 5.1   Lipid Profile  Recent Labs  09/28/16 0355  CHOL 184  HDL 58  LDLCALC 119*  TRIG 36  CHOLHDL 3.2   Urinalysis    Component Value Date/Time   COLORURINE YELLOW 09/27/2016 1048   APPEARANCEUR HAZY (A) 09/27/2016 1048   LABSPEC 1.014 09/27/2016 1048   PHURINE 7.0 09/27/2016 1048   GLUCOSEU NEGATIVE 09/27/2016 1048   HGBUR NEGATIVE 09/27/2016 1048   BILIRUBINUR NEGATIVE 09/27/2016 1048   KETONESUR NEGATIVE 09/27/2016 1048   PROTEINUR NEGATIVE  09/27/2016 1048   NITRITE NEGATIVE 09/27/2016 1048   LEUKOCYTESUR NEGATIVE 09/27/2016 1048      Time coordinating discharge: Over 30 minutes  SIGNED:  Vernell Leep, MD, FACP, FHM. [facility] Pager [phone]  If 7PM-7AM, please contact night-coverage [email or web address] Password [email or web address] 09/28/2016, 6:10 PM

## 2016-09-28 NOTE — Progress Notes (Signed)
OT Cancellation Note and Discharge  Patient Details Name: Amy Hopkins MRN: [id number] DOB: 27-Dec-1965   Cancelled Treatment:    Reason Eval/Treat Not Completed: OT screened, no needs identified, will sign off. Received notification via OT log from PT that pt does not have any OT skilled needs.   Almon Register [phone] 09/28/2016, 5:23 PM

## 2016-09-28 NOTE — Progress Notes (Signed)
New Admission Note: Pt transferred from the [facility] to 6E24  Arrival Method: via stretcher Mental Orientation: alert and oriented x 4 Telemetry: N/A Assessment: Completed Skin: Intact IV: NSL Pain: denies Tubes: None Safety Measures: Safety Fall Prevention Plan has been discussed  Admission: to be completed [address] Orientation: Patient has been orientated to the room, unit and staff.  Family: Husband at bedside  Orders to be reviewed and implemented. Will continue to monitor the patient. Call light has been placed within reach and bed alarm has been activated.   Mady Gemma, BSN, RN-BC Phone: [phone]

## 2016-09-28 NOTE — Evaluation (Signed)
Physical Therapy Evaluation Patient Details Name: Amy Hopkins MRN: [id number] DOB: [birth year] Today's Date: 09/28/2016   History of Present Illness  Pt is a 51 yo female admitted through ED with onset of L LE weakness, skaking and chest pain. All imagine and tests were negative. PMH significant for depression, psoriasis, melanoma, migraine, c-spine stenosis.   Clinical Impression  Pt presents with the above diagnosis and below deficits for therapy evaluation. Prior to admission, pt was completely independent and working full time. Pt walked 3 mi a day with her neighbor. Pt currently requires supervision for mobility but is mostly independent with mobility this session with static movements and min guard to supervision for dynamic mobility. Pt does, however, progress to independent with mobility by the completion of today's session. Pt is expected to return to her baseline level of functioning without PT follow-up. NO further skilled PT required at this time. Please re-order if any new needs arise.     Follow Up Recommendations No PT follow up    Equipment Recommendations  None recommended by PT    Recommendations for Other Services       Precautions / Restrictions Precautions Precautions: None Restrictions Weight Bearing Restrictions: No      Mobility  Bed Mobility Overal bed mobility: Independent             General bed mobility comments: able to get out of bed without assistance  Transfers Overall transfer level: Modified independent               General transfer comment: sit to stand with no hands on assistance  Ambulation/Gait Ambulation/Gait assistance: Min guard;Supervision;Independent Ambulation Distance (Feet): 250 Feet Assistive device: None Gait Pattern/deviations: Step-through pattern;Narrow base of support;Decreased stride length Gait velocity: slower Gait velocity interpretation: Below normal speed for age/gender General Gait Details: decreased  stride length bilaterally, initally pt is very hesitant with gait and a little unsteady but this improves to independent by the completion of gait.   Stairs            Wheelchair Mobility    Modified Rankin (Stroke Patients Only)       Balance Overall balance assessment: Needs assistance Sitting-balance support: No upper extremity supported;Feet supported Sitting balance-Leahy Scale: Normal     Standing balance support: No upper extremity supported;During functional activity Standing balance-Leahy Scale: Fair                               Pertinent Vitals/Pain Pain Assessment: No/denies pain    Home Living Family/patient expects to be discharged to:: Private residence Living Arrangements: Spouse/significant other Available Help at Discharge: Family;Available 24 hours/day Type of Home: House Home Access: Stairs to enter Entrance Stairs-Rails: None Entrance Stairs-Number of Steps: 2 Home Layout: One level Home Equipment: None      Prior Function Level of Independence: Independent         Comments: completely independent and driving. Working full time prior to admission     Hand Dominance   Dominant Hand: Right    Extremity/Trunk Assessment   Upper Extremity Assessment Upper Extremity Assessment: Overall WFL for tasks assessed    Lower Extremity Assessment Lower Extremity Assessment: LLE deficits/detail;Overall WFL for tasks assessed LLE Deficits / Details: mininal weakness in LLE noted with AROM, no strength deficits noted with MMT    Cervical / Trunk Assessment Cervical / Trunk Assessment: Normal  Communication   Communication: No difficulties  Cognition Arousal/Alertness: Awake/alert  Behavior During Therapy: WFL for tasks assessed/performed Overall Cognitive Status: Within Functional Limits for tasks assessed                                        General Comments      Exercises     Assessment/Plan    PT  Assessment Patent does not need any further PT services  PT Problem List         PT Treatment Interventions      PT Goals (Current goals can be found in the Care Plan section)  Acute Rehab PT Goals Patient Stated Goal: to get back home PT Goal Formulation: All assessment and education complete, DC therapy    Frequency     Barriers to discharge        Co-evaluation               AM-PAC PT "6 Clicks" Daily Activity  Outcome Measure Difficulty turning over in bed (including adjusting bedclothes, sheets and blankets)?: None Difficulty moving from lying on back to sitting on the side of the bed? : None Difficulty sitting down on and standing up from a chair with arms (e.g., wheelchair, bedside commode, etc,.)?: None Help needed moving to and from a bed to chair (including a wheelchair)?: A Little Help needed walking in hospital room?: A Little Help needed climbing 3-5 steps with a railing? : A Little 6 Click Score: 21    End of Session Equipment Utilized During Treatment: Gait belt Activity Tolerance: Patient tolerated treatment well Patient left: in chair;with call bell/phone within reach Nurse Communication: Mobility status PT Visit Diagnosis: Muscle weakness (generalized) (M62.81)    Time: 7711-6579 PT Time Calculation (min) (ACUTE ONLY): 12 min   Charges:   PT Evaluation $PT Eval Low Complexity: 1 Low     PT G Codes:   PT G-Codes **NOT FOR INPATIENT CLASS** Functional Assessment Tool Used: AM-PAC 6 Clicks Basic Mobility;Clinical judgement Functional Limitation: Mobility: Walking and moving around Mobility: Walking and Moving Around Current Status (U3833): At least 20 percent but less than 40 percent impaired, limited or restricted Mobility: Walking and Moving Around Goal Status [phone]): At least 20 percent but less than 40 percent impaired, limited or restricted Mobility: Walking and Moving Around Discharge Status [phone]): At least 20 percent but less than 40  percent impaired, limited or restricted    Scheryl Marten PT, DPT  [phone]   Shanon Rosser 09/28/2016, 12:31 PM

## 2016-09-28 NOTE — Telephone Encounter (Signed)
I spoke to [place] about a couple things: 1.   I compare the MRI of the brain from last month to the one from last year.   There is no significant difference. 2.   She presented to the hospital yesterday due to leg weakness and tremors. I reviewed the MRI of the spine. The spinal cord appears normal she does have some mild spinal stenosis but it should not affect leg strength. The MRI of the thoracic spine was fine. 3.    Her next appointment was going to be in February. Since she had worsening this week, we will move that appointment up to next month.    Please see if he you can get her scheduled for an appointment with mein 3-5 weeks

## 2016-09-28 NOTE — Progress Notes (Signed)
Discharge instructions and medications discussed with patient.  All questions answered.  

## 2016-10-01 DIAGNOSIS — R9089 Other abnormal findings on diagnostic imaging of central nervous system: Secondary | ICD-10-CM | POA: Diagnosis not present

## 2016-10-01 DIAGNOSIS — L409 Psoriasis, unspecified: Secondary | ICD-10-CM | POA: Diagnosis not present

## 2016-10-01 DIAGNOSIS — G43909 Migraine, unspecified, not intractable, without status migrainosus: Secondary | ICD-10-CM | POA: Diagnosis not present

## 2016-10-01 DIAGNOSIS — Z09 Encounter for follow-up examination after completed treatment for conditions other than malignant neoplasm: Secondary | ICD-10-CM | POA: Diagnosis not present

## 2016-10-01 DIAGNOSIS — Z85820 Personal history of malignant melanoma of skin: Secondary | ICD-10-CM | POA: Diagnosis not present

## 2016-10-01 DIAGNOSIS — Z823 Family history of stroke: Secondary | ICD-10-CM | POA: Diagnosis not present

## 2016-10-01 DIAGNOSIS — E7800 Pure hypercholesterolemia, unspecified: Secondary | ICD-10-CM | POA: Diagnosis not present

## 2016-10-01 DIAGNOSIS — R634 Abnormal weight loss: Secondary | ICD-10-CM | POA: Diagnosis not present

## 2016-10-01 NOTE — Telephone Encounter (Signed)
I have spoken with Amy Hopkins and confirmed that she understood the MRI results that RAS reviewed with her.  Appt. moved to 11/07/16. She is agreeable with this; will call if condition changes and she needs to be seen sooner/fim

## 2016-10-18 ENCOUNTER — Encounter: Payer: Self-pay | Admitting: Neurology

## 2016-10-18 ENCOUNTER — Telehealth: Payer: Self-pay | Admitting: *Deleted

## 2016-10-18 MED ORDER — TOPIRAMATE 25 MG PO TABS: 25.0000 mg | ORAL_TABLET | Freq: Every day | ORAL | 1 refills | Status: DC

## 2016-10-18 NOTE — Telephone Encounter (Signed)
Topamax escribed to [facility] per emailed request/fim

## 2016-11-07 ENCOUNTER — Ambulatory Visit (INDEPENDENT_AMBULATORY_CARE_PROVIDER_SITE_OTHER): Payer: BLUE CROSS/BLUE SHIELD | Admitting: Neurology

## 2016-11-07 ENCOUNTER — Encounter: Payer: Self-pay | Admitting: Neurology

## 2016-11-07 VITALS — BP 110/78 | HR 81 | Resp 16 | Ht 60.0 in | Wt 124.5 lb

## 2016-11-07 DIAGNOSIS — G43009 Migraine without aura, not intractable, without status migrainosus: Secondary | ICD-10-CM | POA: Diagnosis not present

## 2016-11-07 DIAGNOSIS — R251 Tremor, unspecified: Secondary | ICD-10-CM | POA: Insufficient documentation

## 2016-11-07 DIAGNOSIS — R9089 Other abnormal findings on diagnostic imaging of central nervous system: Secondary | ICD-10-CM

## 2016-11-07 DIAGNOSIS — F411 Generalized anxiety disorder: Secondary | ICD-10-CM | POA: Diagnosis not present

## 2016-11-07 DIAGNOSIS — R404 Transient alteration of awareness: Secondary | ICD-10-CM | POA: Diagnosis not present

## 2016-11-07 NOTE — Progress Notes (Signed)
[facility]  PATIENT: Amy Hopkins DOB: 1965/10/02  REFERRING DOCTOR OR PCP:  Finis Bud SOURCE: Patient, notes from Dr. Dory Larsen and Dr. Trula Ore, MRI reports, lab reports, MRI images on CD  _________________________________   HISTORICAL  CHIEF COMPLAINT:  Chief Complaint  Patient presents with  . Numbness    Sts. numbness in right foot/dragging right foot has been the same.  Was exacerbated on 09/27/16 and she was seen in the ER, where MRI c-spine showed no worsening of stenosis.  Topamax was decreased to 25mg  qhs, which pt. sts. she is still taking.  Sts. she continued Lamictal 100mg  bid, just recently decreased to once daily.  She was referred for psych eval, sts. has not followed thru with this./fim    HISTORY OF PRESENT ILLNESS:  Amy Hopkins is a 51 yo woman right leg numbness, right foot draggingno, n-specific changes on her MRI, cervical spinal stenosis and possible seizure activity in the past.    She went to the ED 09/27/16 after presenting with right leg shaking, much worse when standing.   She also felt weaker on her right leg and was dragging it some.    I looked at a video that she took. She was standing at the time. It did not look like seizure activity. She went to the ED and had an MRI performed.   I personally looked at the images and compared to the MRI from December 2017. The current MRI shows C3C4 (mildly worse than previous MRI), C5C6 and C6C7 spinal stenosis.   There is mild anterolisthesis at C7T1.    The spinal cord appears normal with no evidence of MS or myelopathy.  Numbness/weakness:   Her right leg gets numbness up to the thigh.   The numbness comes and goes.   She feels strength is reduced intermittently, noting more weakness when she also has numbness.     She has mild urinary frequency but no nocturia.     Bending her neck does not exacerbate symptoms.      Migraines:   Her migraines are much better on Topamax 25 mg nightly      Abnormal MRI/Possible MS:  Because of a spell of transient alteration of awareness in August 2017, she had an MRI.   The MRI shows nonspecific white matter changes slightly progressed her to the 2011 MRI.  He was felt by one neurologist to have MS scribe Copaxone but never took it. She was then referred to me for second opinion. I personally reviewed the MRIs.    I believe that the changes are most likely to represent chronic microvascular ischemic change or changes from the migraine headaches. I think demyelination from MS is significantly less likely so I did not think that she should start a disease modifying therapy.Also,   a lumbar puncture was reportedly negative.  There is no family history of MS.   Transient alteration of awareness.   She had a seizure in 2011 and also had an episode of altered awareness in August 2017 the first episode in 2011 did have generalized tonic-clonic activity and occurred while on Wellbutrin and tramadol. . The second spell did not have any precipitating factor though it is uncertain whether it represented a seizure as there was no loss of consciousness.     She went to [facility] emergency room and was referred to Dr. Trula Ore.  The EEG was read as abnormal (right parietal and temporal slowing) and she was started on lamotrigine 100 mg  by mouth twice a day. She tolerates it well. We discussed that it is uncertain about the significance of the EEG findings as a little slowing is not necessarily abnormal though it can be seen in people who have had seizures. Since lamotrigine also could help some other symptoms she will continue on it for the time being.  Migraine:   She has 3 -4 migraines a month, usually improved with  She has a long history of migraine headaches. For the most part, these are common migraine headaches without aura. She will get pounding bilateral pain, photophobia, phonophobia and nausea and sometimes vomiting. Her head will make the pain worse. Imitrex usually  helps the pain.   These are actually occurring less since starting lamotrigine.  CTS:   She had a nerve conduction and EMG study performed. It showed that she had the nerve conduction study showed moderate right and mild left carpal tunnel syndrome and no-shows showed mild cervical and lumbosacral radiculopathies.    Anxiety:  She has some anxiety and is on Lexapro.  She tolerates it well.   REVIEW OF SYSTEMS: Constitutional: No fevers, chills, sweats, or change in appetite Eyes: No visual changes, double vision, eye pain Ear, nose and throat: No hearing loss, ear pain, nasal congestion, sore throat Cardiovascular: No chest pain, palpitations Respiratory: No shortness of breath at rest or with exertion.   No wheezes GastrointestinaI: No nausea, vomiting, diarrhea, abdominal pain, fecal incontinence Genitourinary: No dysuria, urinary retention or frequency.  No nocturia. Musculoskeletal: No neck pain, back pain Integumentary: No rash, pruritus, skin lesions Neurological: as above Psychiatric: No depression at this time. She has anxiety Endocrine: No palpitations, diaphoresis, change in appetite, change in weigh or increased thirst Hematologic/Lymphatic: No anemia, purpura, petechiae. Allergic/Immunologic: No itchy/runny eyes, nasal congestion, recent allergic reactions, rashes  ALLERGIES: Allergies  Allergen Reactions  . Tramadol Other (See Comments)    HOME MEDICATIONS:  Current Outpatient Prescriptions:  .  acetaminophen (TYLENOL) 500 MG tablet, Take 1,000 mg by mouth every 6 (six) hours as needed for mild pain. , Disp: , Rfl:  .  aspirin 81 MG chewable tablet, Chew 81 mg by mouth daily., Disp: , Rfl:  .  escitalopram (LEXAPRO) 10 MG tablet, Take 10 mg by mouth daily. , Disp: , Rfl:  .  ibuprofen (ADVIL,MOTRIN) 200 MG tablet, Take 200 mg by mouth every 6 (six) hours as needed for mild pain. , Disp: , Rfl:  .  lamoTRIgine (LAMICTAL) 100 MG tablet, Take 100 mg by mouth 2 (two)  times daily., Disp: , Rfl:  .  topiramate (TOPAMAX) 25 MG tablet, Take 1 tablet (25 mg total) by mouth at bedtime., Disp: 90 tablet, Rfl: 1 .  SUMAtriptan (IMITREX) 100 MG tablet, Take 1 tablet (100 mg total) by mouth once. (Patient taking differently: Take 100 mg by mouth daily. ), Disp: 10 tablet, Rfl: 11  PAST MEDICAL HISTORY: Past Medical History:  Diagnosis Date  . Cancer ([place])   . Headache   . Melanoma ([place])   . Multiple sclerosis ([place])   . Psoriasis   . Seizures ([place])   . Vision abnormalities     PAST SURGICAL HISTORY: Past Surgical History:  Procedure Laterality Date  . MELANOMA EXCISION    . MYOMECTOMY      FAMILY HISTORY: Family History  Problem Relation Age of Onset  . Stroke Mother   . Breast cancer Mother   . Hypertension Father   . High Cholesterol Father   . Psoriasis Father   .  Healthy Brother   . Transient ischemic attack Maternal Grandmother     SOCIAL HISTORY:  Social History   Social History  . Marital status: Married    Spouse name: Dominica Severin  . Number of children: N/A  . Years of education: N/A   Occupational History  . Not on file.   Social History Main Topics  . Smoking status: Former Research scientist (life sciences)  . Smokeless tobacco: Never Used  . Alcohol use Yes     Comment: occasional  . Drug use: No  . Sexual activity: Not on file   Other Topics Concern  . Not on file   Social History Narrative  . No narrative on file     PHYSICAL EXAM  Vitals:   11/07/16 1122  BP: 110/78  Pulse: 81  Resp: 16  Weight: 124 lb 8 oz (56.5 kg)  Height: 5' (1.524 m)    Body mass index is 24.31 kg/m.   General: The patient is well-developed and well-nourished and in no acute distress   Neurologic Exam  Mental status: The patient is alert and oriented x 3 at the time of the examination. The patient has apparent normal recent and remote memory, with an apparently normal attention span and concentration ability.   Speech is normal.  Cranial nerves:  Extraocular movements are full.  There is good facial  Strength .  Trapezius and sternocleidomastoid strength is normal. No dysarthria is noted.  The tongue is midline, and the patient has symmetric elevation of the soft palate. No obvious hearing deficits are noted.  Motor:  Muscle bulk is normal.   Tone is normal. Strength is  5 / 5 in all 4 extremities.    Gait and station: Station is normal.   Gait is normal. Tandem gait is normal. Romberg is negative.   Reflexes: Deep tendon reflexes are symmetric in the arms.   DTRs were 3+ at the knees but right > left  and 2+ elsewherer.      DIAGNOSTIC DATA (LABS, IMAGING, TESTING) - I reviewed patient records, labs, notes, testing and imaging myself where available.      ASSESSMENT AND PLAN  Abnormal brain MRI  Transient alteration of awareness  Migraine without aura and without status migrainosus, not intractable  Anxiety state  Shaking  1.   We discussed that that the etiology of the episode that occurred last month is uncertain. I do not think that she was having a seizure at the time. No changes in medications is needed.     2.   The MRI of the cervical spine does show spinal stenosis at several levels and there has been some progression since the previous MRI.Marland Kitchen There is no spinal cord compression. I do not think that she needs to see neurosurgery at this time. However, if she has more constant weakness or more episodes of transient weakness we may want to get a neurosurgical opinion.  3.    We also discussed the MRI of the brain showing some nonspecific white matter foci. Additionally the lumbar puncture was reportedly negative for changes often associated with MS.   Although I cannot be 703% certain, I think it is unlikely that she has MS. The changes on the MRI are nonspecific and could be due to migraines, chronic microvessel ischemic change or other remote change.  She has more symptoms, I would consider rechecking an MRI. 4.     She will return to see me in 6 months.  Call sooner if any new  or worsening neurologic symptoms.  If she has more episodes of leg numbness and weakness, consider an MRI of the lumbar spine to better characterize  45 minutes face-to-face evaluation with greater than one half of the time counseling and coordinating care.  Cuauhtemoc Huegel A. Felecia Shelling, MD, PhD 3/79/4327, 6:14 PM Certified in Neurology, Clinical Neurophysiology, Sleep Medicine, Pain Medicine and Neuroimaging  [facility] [address] [place], [place] [address] [phone]

## 2016-11-15 DIAGNOSIS — L814 Other melanin hyperpigmentation: Secondary | ICD-10-CM | POA: Diagnosis not present

## 2016-11-15 DIAGNOSIS — L700 Acne vulgaris: Secondary | ICD-10-CM | POA: Diagnosis not present

## 2016-11-15 DIAGNOSIS — D485 Neoplasm of uncertain behavior of skin: Secondary | ICD-10-CM | POA: Diagnosis not present

## 2016-11-15 DIAGNOSIS — L905 Scar conditions and fibrosis of skin: Secondary | ICD-10-CM | POA: Diagnosis not present

## 2016-11-15 DIAGNOSIS — L400 Psoriasis vulgaris: Secondary | ICD-10-CM | POA: Diagnosis not present

## 2016-11-15 DIAGNOSIS — Z85820 Personal history of malignant melanoma of skin: Secondary | ICD-10-CM | POA: Diagnosis not present

## 2016-11-16 ENCOUNTER — Encounter: Payer: Self-pay | Admitting: Gastroenterology

## 2016-11-16 DIAGNOSIS — Z23 Encounter for immunization: Secondary | ICD-10-CM | POA: Diagnosis not present

## 2016-11-19 DIAGNOSIS — M9903 Segmental and somatic dysfunction of lumbar region: Secondary | ICD-10-CM | POA: Diagnosis not present

## 2016-11-19 DIAGNOSIS — M531 Cervicobrachial syndrome: Secondary | ICD-10-CM | POA: Diagnosis not present

## 2016-11-19 DIAGNOSIS — M9901 Segmental and somatic dysfunction of cervical region: Secondary | ICD-10-CM | POA: Diagnosis not present

## 2016-11-19 DIAGNOSIS — M5441 Lumbago with sciatica, right side: Secondary | ICD-10-CM | POA: Diagnosis not present

## 2016-11-20 DIAGNOSIS — M5441 Lumbago with sciatica, right side: Secondary | ICD-10-CM | POA: Diagnosis not present

## 2016-11-20 DIAGNOSIS — M9903 Segmental and somatic dysfunction of lumbar region: Secondary | ICD-10-CM | POA: Diagnosis not present

## 2016-11-20 DIAGNOSIS — M531 Cervicobrachial syndrome: Secondary | ICD-10-CM | POA: Diagnosis not present

## 2016-11-20 DIAGNOSIS — M9901 Segmental and somatic dysfunction of cervical region: Secondary | ICD-10-CM | POA: Diagnosis not present

## 2016-11-21 DIAGNOSIS — M5441 Lumbago with sciatica, right side: Secondary | ICD-10-CM | POA: Diagnosis not present

## 2016-11-21 DIAGNOSIS — M9903 Segmental and somatic dysfunction of lumbar region: Secondary | ICD-10-CM | POA: Diagnosis not present

## 2016-11-21 DIAGNOSIS — M531 Cervicobrachial syndrome: Secondary | ICD-10-CM | POA: Diagnosis not present

## 2016-11-21 DIAGNOSIS — M9901 Segmental and somatic dysfunction of cervical region: Secondary | ICD-10-CM | POA: Diagnosis not present

## 2016-11-22 DIAGNOSIS — M531 Cervicobrachial syndrome: Secondary | ICD-10-CM | POA: Diagnosis not present

## 2016-11-22 DIAGNOSIS — M9903 Segmental and somatic dysfunction of lumbar region: Secondary | ICD-10-CM | POA: Diagnosis not present

## 2016-11-22 DIAGNOSIS — M9901 Segmental and somatic dysfunction of cervical region: Secondary | ICD-10-CM | POA: Diagnosis not present

## 2016-11-22 DIAGNOSIS — M5441 Lumbago with sciatica, right side: Secondary | ICD-10-CM | POA: Diagnosis not present

## 2016-11-26 DIAGNOSIS — M5441 Lumbago with sciatica, right side: Secondary | ICD-10-CM | POA: Diagnosis not present

## 2016-11-26 DIAGNOSIS — M531 Cervicobrachial syndrome: Secondary | ICD-10-CM | POA: Diagnosis not present

## 2016-11-26 DIAGNOSIS — M9903 Segmental and somatic dysfunction of lumbar region: Secondary | ICD-10-CM | POA: Diagnosis not present

## 2016-11-26 DIAGNOSIS — M9901 Segmental and somatic dysfunction of cervical region: Secondary | ICD-10-CM | POA: Diagnosis not present

## 2016-11-27 ENCOUNTER — Ambulatory Visit (AMBULATORY_SURGERY_CENTER): Payer: Self-pay | Admitting: *Deleted

## 2016-11-27 ENCOUNTER — Encounter: Payer: Self-pay | Admitting: Neurology

## 2016-11-27 ENCOUNTER — Encounter: Payer: Self-pay | Admitting: Gastroenterology

## 2016-11-27 VITALS — Ht 60.0 in | Wt 125.0 lb

## 2016-11-27 DIAGNOSIS — M9901 Segmental and somatic dysfunction of cervical region: Secondary | ICD-10-CM | POA: Diagnosis not present

## 2016-11-27 DIAGNOSIS — M9903 Segmental and somatic dysfunction of lumbar region: Secondary | ICD-10-CM | POA: Diagnosis not present

## 2016-11-27 DIAGNOSIS — M5441 Lumbago with sciatica, right side: Secondary | ICD-10-CM | POA: Diagnosis not present

## 2016-11-27 DIAGNOSIS — Z1211 Encounter for screening for malignant neoplasm of colon: Secondary | ICD-10-CM

## 2016-11-27 DIAGNOSIS — M531 Cervicobrachial syndrome: Secondary | ICD-10-CM | POA: Diagnosis not present

## 2016-11-27 MED ORDER — NA SULFATE-K SULFATE-MG SULF 17.5-3.13-1.6 GM/177ML PO SOLN: 1.0000 | Freq: Once | ORAL | 0 refills | Status: AC

## 2016-11-27 NOTE — Progress Notes (Signed)
No egg or soy allergy known to patient  No issues with past sedation with any surgeries  or procedures, no intubation problems -- pt states hard to wake post op  No diet pills per patient No home 02 use per patient  No blood thinners per patient  Pt denies issues with constipation  No A fib or A flutter  EMMI video sent to pt's e mail  $15 COUPON TO PT FOR SUPREP

## 2016-11-28 DIAGNOSIS — M5441 Lumbago with sciatica, right side: Secondary | ICD-10-CM | POA: Diagnosis not present

## 2016-11-28 DIAGNOSIS — M9901 Segmental and somatic dysfunction of cervical region: Secondary | ICD-10-CM | POA: Diagnosis not present

## 2016-11-28 DIAGNOSIS — M531 Cervicobrachial syndrome: Secondary | ICD-10-CM | POA: Diagnosis not present

## 2016-11-28 DIAGNOSIS — M9903 Segmental and somatic dysfunction of lumbar region: Secondary | ICD-10-CM | POA: Diagnosis not present

## 2016-12-03 DIAGNOSIS — M531 Cervicobrachial syndrome: Secondary | ICD-10-CM | POA: Diagnosis not present

## 2016-12-03 DIAGNOSIS — M9901 Segmental and somatic dysfunction of cervical region: Secondary | ICD-10-CM | POA: Diagnosis not present

## 2016-12-03 DIAGNOSIS — M9903 Segmental and somatic dysfunction of lumbar region: Secondary | ICD-10-CM | POA: Diagnosis not present

## 2016-12-03 DIAGNOSIS — M5441 Lumbago with sciatica, right side: Secondary | ICD-10-CM | POA: Diagnosis not present

## 2016-12-04 DIAGNOSIS — M5441 Lumbago with sciatica, right side: Secondary | ICD-10-CM | POA: Diagnosis not present

## 2016-12-04 DIAGNOSIS — M9901 Segmental and somatic dysfunction of cervical region: Secondary | ICD-10-CM | POA: Diagnosis not present

## 2016-12-04 DIAGNOSIS — M531 Cervicobrachial syndrome: Secondary | ICD-10-CM | POA: Diagnosis not present

## 2016-12-04 DIAGNOSIS — M9903 Segmental and somatic dysfunction of lumbar region: Secondary | ICD-10-CM | POA: Diagnosis not present

## 2016-12-05 DIAGNOSIS — M9901 Segmental and somatic dysfunction of cervical region: Secondary | ICD-10-CM | POA: Diagnosis not present

## 2016-12-05 DIAGNOSIS — M5441 Lumbago with sciatica, right side: Secondary | ICD-10-CM | POA: Diagnosis not present

## 2016-12-05 DIAGNOSIS — M9903 Segmental and somatic dysfunction of lumbar region: Secondary | ICD-10-CM | POA: Diagnosis not present

## 2016-12-05 DIAGNOSIS — M531 Cervicobrachial syndrome: Secondary | ICD-10-CM | POA: Diagnosis not present

## 2016-12-06 ENCOUNTER — Encounter: Payer: Self-pay | Admitting: Gastroenterology

## 2016-12-06 ENCOUNTER — Ambulatory Visit (AMBULATORY_SURGERY_CENTER): Payer: BLUE CROSS/BLUE SHIELD | Admitting: Gastroenterology

## 2016-12-06 VITALS — BP 115/84 | HR 69 | Temp 97.3°F | Resp 18 | Ht 60.0 in | Wt 124.0 lb

## 2016-12-06 DIAGNOSIS — Z1212 Encounter for screening for malignant neoplasm of rectum: Secondary | ICD-10-CM | POA: Diagnosis not present

## 2016-12-06 DIAGNOSIS — Z1211 Encounter for screening for malignant neoplasm of colon: Secondary | ICD-10-CM

## 2016-12-06 MED ORDER — SODIUM CHLORIDE 0.9 % IV SOLN: 500.0000 mL | INTRAVENOUS | Status: AC

## 2016-12-06 NOTE — Progress Notes (Signed)
To recovery, report to RN, VSS. 

## 2016-12-06 NOTE — Patient Instructions (Signed)
YOU HAD AN ENDOSCOPIC PROCEDURE TODAY AT [facility]:   Refer to the procedure report that was given to you for any specific questions about what was found during the examination.  If the procedure report does not answer your questions, please call your gastroenterologist to clarify.  If you requested that your care partner not be given the details of your procedure findings, then the procedure report has been included in a sealed envelope for you to review at your convenience later.  YOU SHOULD EXPECT: Some feelings of bloating in the abdomen. Passage of more gas than usual.  Walking can help get rid of the air that was put into your GI tract during the procedure and reduce the bloating. If you had a lower endoscopy (such as a colonoscopy or flexible sigmoidoscopy) you may notice spotting of blood in your stool or on the toilet paper. If you underwent a bowel prep for your procedure, you may not have a normal bowel movement for a few days.  Please Note:  You might notice some irritation and congestion in your nose or some drainage.  This is from the oxygen used during your procedure.  There is no need for concern and it should clear up in a day or so.  SYMPTOMS TO REPORT IMMEDIATELY:   Following lower endoscopy (colonoscopy or flexible sigmoidoscopy):  Excessive amounts of blood in the stool  Significant tenderness or worsening of abdominal pains  Swelling of the abdomen that is new, acute  Fever of 100F or higher   For urgent or emergent issues, a gastroenterologist can be reached at any hour by calling [phone].   DIET:  We do recommend a small meal at first, but then you may proceed to your regular diet.  Drink plenty of fluids but you should avoid alcoholic beverages for 24 hours.  ACTIVITY:  You should plan to take it easy for the rest of today and you should NOT DRIVE or use heavy machinery until tomorrow (because of the sedation medicines used during the test).     FOLLOW UP: Our staff will call the number listed on your records the next business day following your procedure to check on you and address any questions or concerns that you may have regarding the information given to you following your procedure. If we do not reach you, we will leave a message.  However, if you are feeling well and you are not experiencing any problems, there is no need to return our call.  We will assume that you have returned to your regular daily activities without incident.  If any biopsies were taken you will be contacted by phone or by letter within the next 1-3 weeks.  Please call us at [phone] if you have not heard about the biopsies in 3 weeks.    SIGNATURES/CONFIDENTIALITY: You and/or your care partner have signed paperwork which will be entered into your electronic medical record.  These signatures attest to the fact that that the information above on your After Visit Summary has been reviewed and is understood.  Full responsibility of the confidentiality of this discharge information lies with you and/or your care-partner.  Recall colonoscopy 10 years-2028.

## 2016-12-06 NOTE — Op Note (Signed)
[place] Patient Name: Amy Hopkins Procedure Date: 12/06/2016 11:23 AM MRN: [id number] Endoscopist: Remo Lipps P. Armbruster MD, MD Age: 51 Referring MD:  Date of Birth: November 20, 1965 Gender: Female Account #: [id number] Procedure:                Colonoscopy Indications:              Screening for colorectal malignant neoplasm, This                            is the patient's first colonoscopy Medicines:                Monitored Anesthesia Care Procedure:                Pre-Anesthesia Assessment:                           - Prior to the procedure, a History and Physical                            was performed, and patient medications and                            allergies were reviewed. The patient's tolerance of                            previous anesthesia was also reviewed. The risks                            and benefits of the procedure and the sedation                            options and risks were discussed with the patient.                            All questions were answered, and informed consent                            was obtained. Prior Anticoagulants: The patient has                            taken no previous anticoagulant or antiplatelet                            agents. ASA Grade Assessment: II - A patient with                            mild systemic disease. After reviewing the risks                            and benefits, the patient was deemed in                            satisfactory condition to undergo the procedure.  After obtaining informed consent, the colonoscope                            was passed under direct vision. Throughout the                            procedure, the patient's blood pressure, pulse, and                            oxygen saturations were monitored continuously. The                            Colonoscope was introduced through the anus and                            advanced to the  the cecum, identified by                            appendiceal orifice and ileocecal valve. The                            colonoscopy was performed without difficulty. The                            patient tolerated the procedure well. The quality                            of the bowel preparation was good. The ileocecal                            valve, appendiceal orifice, and rectum were                            photographed. Scope In: 11:29:53 AM Scope Out: 11:43:09 AM Scope Withdrawal Time: 0 hours 9 minutes 44 seconds  Total Procedure Duration: 0 hours 13 minutes 16 seconds  Findings:                 The perianal and digital rectal examinations were                            normal.                           The entire examined colon appeared normal on direct                            and retroflexion views. No polyps seen. Complications:            No immediate complications. Estimated blood loss:                            None. Estimated Blood Loss:     Estimated blood loss: none. Impression:               - The entire examined colon is normal on direct  and                            retroflexion views.                           - No specimens collected. Recommendation:           - Patient has a contact number available for                            emergencies. The signs and symptoms of potential                            delayed complications were discussed with the                            patient. Return to normal activities tomorrow.                            Written discharge instructions were provided to the                            patient.                           - Resume previous diet.                           - Continue present medications.                           - Repeat colonoscopy in 10 years for screening                            purposes. Remo Lipps P. Armbruster MD, MD 12/06/2016 11:45:47 AM This report has been signed electronically.

## 2016-12-07 ENCOUNTER — Telehealth: Payer: Self-pay

## 2016-12-07 NOTE — Telephone Encounter (Signed)
  Follow up Call-  Call back number 12/06/2016  Post procedure Call Back phone  # [phone]  Permission to leave phone message Yes  Some recent data might be hidden     Patient questions:  Do you have a fever, pain , or abdominal swelling? No. Pain Score  0 *  Have you tolerated food without any problems? Yes.    Have you been able to return to your normal activities? Yes.    Do you have any questions about your discharge instructions: Diet   No. Medications  No. Follow up visit  No.  Do you have questions or concerns about your Care? No.  Actions: * If pain score is 4 or above: No action needed, pain <4.

## 2016-12-10 DIAGNOSIS — M9901 Segmental and somatic dysfunction of cervical region: Secondary | ICD-10-CM | POA: Diagnosis not present

## 2016-12-10 DIAGNOSIS — M5441 Lumbago with sciatica, right side: Secondary | ICD-10-CM | POA: Diagnosis not present

## 2016-12-10 DIAGNOSIS — M531 Cervicobrachial syndrome: Secondary | ICD-10-CM | POA: Diagnosis not present

## 2016-12-10 DIAGNOSIS — M9903 Segmental and somatic dysfunction of lumbar region: Secondary | ICD-10-CM | POA: Diagnosis not present

## 2016-12-11 DIAGNOSIS — M5441 Lumbago with sciatica, right side: Secondary | ICD-10-CM | POA: Diagnosis not present

## 2016-12-11 DIAGNOSIS — M9903 Segmental and somatic dysfunction of lumbar region: Secondary | ICD-10-CM | POA: Diagnosis not present

## 2016-12-11 DIAGNOSIS — M531 Cervicobrachial syndrome: Secondary | ICD-10-CM | POA: Diagnosis not present

## 2016-12-11 DIAGNOSIS — M9901 Segmental and somatic dysfunction of cervical region: Secondary | ICD-10-CM | POA: Diagnosis not present

## 2016-12-13 DIAGNOSIS — M531 Cervicobrachial syndrome: Secondary | ICD-10-CM | POA: Diagnosis not present

## 2016-12-13 DIAGNOSIS — M9901 Segmental and somatic dysfunction of cervical region: Secondary | ICD-10-CM | POA: Diagnosis not present

## 2016-12-13 DIAGNOSIS — M9903 Segmental and somatic dysfunction of lumbar region: Secondary | ICD-10-CM | POA: Diagnosis not present

## 2016-12-13 DIAGNOSIS — M5441 Lumbago with sciatica, right side: Secondary | ICD-10-CM | POA: Diagnosis not present

## 2016-12-31 DIAGNOSIS — M531 Cervicobrachial syndrome: Secondary | ICD-10-CM | POA: Diagnosis not present

## 2016-12-31 DIAGNOSIS — M9901 Segmental and somatic dysfunction of cervical region: Secondary | ICD-10-CM | POA: Diagnosis not present

## 2016-12-31 DIAGNOSIS — M5441 Lumbago with sciatica, right side: Secondary | ICD-10-CM | POA: Diagnosis not present

## 2016-12-31 DIAGNOSIS — M9903 Segmental and somatic dysfunction of lumbar region: Secondary | ICD-10-CM | POA: Diagnosis not present

## 2017-01-01 DIAGNOSIS — M531 Cervicobrachial syndrome: Secondary | ICD-10-CM | POA: Diagnosis not present

## 2017-01-01 DIAGNOSIS — M9903 Segmental and somatic dysfunction of lumbar region: Secondary | ICD-10-CM | POA: Diagnosis not present

## 2017-01-01 DIAGNOSIS — M9901 Segmental and somatic dysfunction of cervical region: Secondary | ICD-10-CM | POA: Diagnosis not present

## 2017-01-01 DIAGNOSIS — M5441 Lumbago with sciatica, right side: Secondary | ICD-10-CM | POA: Diagnosis not present

## 2017-01-07 DIAGNOSIS — M9903 Segmental and somatic dysfunction of lumbar region: Secondary | ICD-10-CM | POA: Diagnosis not present

## 2017-01-07 DIAGNOSIS — M9901 Segmental and somatic dysfunction of cervical region: Secondary | ICD-10-CM | POA: Diagnosis not present

## 2017-01-07 DIAGNOSIS — M5441 Lumbago with sciatica, right side: Secondary | ICD-10-CM | POA: Diagnosis not present

## 2017-01-07 DIAGNOSIS — M531 Cervicobrachial syndrome: Secondary | ICD-10-CM | POA: Diagnosis not present

## 2017-01-15 DIAGNOSIS — M9903 Segmental and somatic dysfunction of lumbar region: Secondary | ICD-10-CM | POA: Diagnosis not present

## 2017-01-15 DIAGNOSIS — M9901 Segmental and somatic dysfunction of cervical region: Secondary | ICD-10-CM | POA: Diagnosis not present

## 2017-01-15 DIAGNOSIS — M531 Cervicobrachial syndrome: Secondary | ICD-10-CM | POA: Diagnosis not present

## 2017-01-15 DIAGNOSIS — M5441 Lumbago with sciatica, right side: Secondary | ICD-10-CM | POA: Diagnosis not present

## 2017-01-17 DIAGNOSIS — M9903 Segmental and somatic dysfunction of lumbar region: Secondary | ICD-10-CM | POA: Diagnosis not present

## 2017-01-17 DIAGNOSIS — M531 Cervicobrachial syndrome: Secondary | ICD-10-CM | POA: Diagnosis not present

## 2017-01-17 DIAGNOSIS — M5441 Lumbago with sciatica, right side: Secondary | ICD-10-CM | POA: Diagnosis not present

## 2017-01-17 DIAGNOSIS — M9901 Segmental and somatic dysfunction of cervical region: Secondary | ICD-10-CM | POA: Diagnosis not present

## 2017-01-23 DIAGNOSIS — M531 Cervicobrachial syndrome: Secondary | ICD-10-CM | POA: Diagnosis not present

## 2017-01-23 DIAGNOSIS — M9901 Segmental and somatic dysfunction of cervical region: Secondary | ICD-10-CM | POA: Diagnosis not present

## 2017-01-23 DIAGNOSIS — M9903 Segmental and somatic dysfunction of lumbar region: Secondary | ICD-10-CM | POA: Diagnosis not present

## 2017-01-23 DIAGNOSIS — M5441 Lumbago with sciatica, right side: Secondary | ICD-10-CM | POA: Diagnosis not present

## 2017-01-24 DIAGNOSIS — M9903 Segmental and somatic dysfunction of lumbar region: Secondary | ICD-10-CM | POA: Diagnosis not present

## 2017-01-24 DIAGNOSIS — M5441 Lumbago with sciatica, right side: Secondary | ICD-10-CM | POA: Diagnosis not present

## 2017-01-24 DIAGNOSIS — M9901 Segmental and somatic dysfunction of cervical region: Secondary | ICD-10-CM | POA: Diagnosis not present

## 2017-01-24 DIAGNOSIS — M531 Cervicobrachial syndrome: Secondary | ICD-10-CM | POA: Diagnosis not present

## 2017-01-30 DIAGNOSIS — M9903 Segmental and somatic dysfunction of lumbar region: Secondary | ICD-10-CM | POA: Diagnosis not present

## 2017-01-30 DIAGNOSIS — M531 Cervicobrachial syndrome: Secondary | ICD-10-CM | POA: Diagnosis not present

## 2017-01-30 DIAGNOSIS — M9901 Segmental and somatic dysfunction of cervical region: Secondary | ICD-10-CM | POA: Diagnosis not present

## 2017-01-30 DIAGNOSIS — M5441 Lumbago with sciatica, right side: Secondary | ICD-10-CM | POA: Diagnosis not present

## 2017-02-04 DIAGNOSIS — M9903 Segmental and somatic dysfunction of lumbar region: Secondary | ICD-10-CM | POA: Diagnosis not present

## 2017-02-04 DIAGNOSIS — M9901 Segmental and somatic dysfunction of cervical region: Secondary | ICD-10-CM | POA: Diagnosis not present

## 2017-02-04 DIAGNOSIS — M531 Cervicobrachial syndrome: Secondary | ICD-10-CM | POA: Diagnosis not present

## 2017-02-04 DIAGNOSIS — M5441 Lumbago with sciatica, right side: Secondary | ICD-10-CM | POA: Diagnosis not present

## 2017-02-05 DIAGNOSIS — M5441 Lumbago with sciatica, right side: Secondary | ICD-10-CM | POA: Diagnosis not present

## 2017-02-05 DIAGNOSIS — M531 Cervicobrachial syndrome: Secondary | ICD-10-CM | POA: Diagnosis not present

## 2017-02-05 DIAGNOSIS — M9901 Segmental and somatic dysfunction of cervical region: Secondary | ICD-10-CM | POA: Diagnosis not present

## 2017-02-05 DIAGNOSIS — M9903 Segmental and somatic dysfunction of lumbar region: Secondary | ICD-10-CM | POA: Diagnosis not present

## 2017-02-06 DIAGNOSIS — M9903 Segmental and somatic dysfunction of lumbar region: Secondary | ICD-10-CM | POA: Diagnosis not present

## 2017-02-06 DIAGNOSIS — M9901 Segmental and somatic dysfunction of cervical region: Secondary | ICD-10-CM | POA: Diagnosis not present

## 2017-02-06 DIAGNOSIS — M5441 Lumbago with sciatica, right side: Secondary | ICD-10-CM | POA: Diagnosis not present

## 2017-02-06 DIAGNOSIS — M531 Cervicobrachial syndrome: Secondary | ICD-10-CM | POA: Diagnosis not present

## 2017-02-07 DIAGNOSIS — M9901 Segmental and somatic dysfunction of cervical region: Secondary | ICD-10-CM | POA: Diagnosis not present

## 2017-02-07 DIAGNOSIS — M5441 Lumbago with sciatica, right side: Secondary | ICD-10-CM | POA: Diagnosis not present

## 2017-02-07 DIAGNOSIS — M531 Cervicobrachial syndrome: Secondary | ICD-10-CM | POA: Diagnosis not present

## 2017-02-07 DIAGNOSIS — M9903 Segmental and somatic dysfunction of lumbar region: Secondary | ICD-10-CM | POA: Diagnosis not present

## 2017-03-28 ENCOUNTER — Other Ambulatory Visit: Payer: Self-pay

## 2017-03-28 ENCOUNTER — Encounter: Payer: Self-pay | Admitting: Neurology

## 2017-03-28 ENCOUNTER — Ambulatory Visit (INDEPENDENT_AMBULATORY_CARE_PROVIDER_SITE_OTHER): Payer: BLUE CROSS/BLUE SHIELD | Admitting: Neurology

## 2017-03-28 VITALS — BP 118/78 | HR 79 | Resp 16 | Wt 127.5 lb

## 2017-03-28 DIAGNOSIS — G43009 Migraine without aura, not intractable, without status migrainosus: Secondary | ICD-10-CM | POA: Diagnosis not present

## 2017-03-28 DIAGNOSIS — R251 Tremor, unspecified: Secondary | ICD-10-CM

## 2017-03-28 DIAGNOSIS — R29898 Other symptoms and signs involving the musculoskeletal system: Secondary | ICD-10-CM | POA: Diagnosis not present

## 2017-03-28 DIAGNOSIS — R404 Transient alteration of awareness: Secondary | ICD-10-CM | POA: Diagnosis not present

## 2017-03-28 NOTE — Progress Notes (Signed)
[facility]  PATIENT: Amy Hopkins DOB: June 06, 1965  REFERRING DOCTOR OR PCP:  Finis Bud SOURCE: Patient, notes from Dr. Dory Larsen and Dr. Trula Ore, MRI reports, lab reports, MRI images on CD  _________________________________   HISTORICAL  CHIEF COMPLAINT:  Chief Complaint  Patient presents with  . Cervical Spinal Stenosis    Sts. has continued to have shaking spells. 1-3 times per month, lasting up to 20-30 min. each.   . Migraines    HISTORY OF PRESENT ILLNESS:  Amy Hopkins is a 52 yo woman right leg numbness, right foot dragging, non-specific changes on her MRI, cervical spinal stenosis and possible seizure activity.    Update 03/28/2017: She is having spells of altered consciousness lasting 20 minutes.     She gets right sided shaking.  No LOC and is able to talk though she does not feel completely baseline with thought.     Her head will shake on the more severe episodes.   They appear to e random without any trigger.    She has had 8 spells since last October.   A few occurred the couple weeks after Christmas and she had more stress but others occurred with typical stress.   Spells come on suddenly but gradually gets better.    She is tired afterward and has a mild foot drop on the right for the rest of the day.     Spells always occur on her right.   Sitting will ease th spell some.   Her first spell was one year ago.   She is on lamotrigine and topiramate (migraines).      In the past, MRI showed some non-specific foci but CSF was normal.      Migraines are doing well.      From 11/07/2016:  She went to the ED 09/27/16 after presenting with right leg shaking, much worse when standing.   She also felt weaker on her right leg and was dragging it some.    I looked at a video that she took. She was standing at the time. It did not look like seizure activity. She went to the ED and had an MRI performed.   I personally looked at the images and compared to  the MRI from December 2017. The current MRI shows C3C4 (mildly worse than previous MRI), C5C6 and C6C7 spinal stenosis.   There is mild anterolisthesis at C7T1.    The spinal cord appears normal with no evidence of MS or myelopathy.  Numbness/weakness:   Her right leg gets numbness up to the thigh.   The numbness comes and goes.   She feels strength is reduced intermittently, noting more weakness when she also has numbness.     She has mild urinary frequency but no nocturia.     Bending her neck does not exacerbate symptoms.      Migraines:   Her migraines are much better on Topamax 25 mg nightly    Abnormal MRI/Possible MS:  Because of a spell of transient alteration of awareness in August 2017, she had an MRI.   The MRI shows nonspecific white matter changes slightly progressed her to the 2011 MRI.  He was felt by one neurologist to have MS scribe Copaxone but never took it. She was then referred to me for second opinion. I personally reviewed the MRIs.    I believe that the changes are most likely to represent chronic microvascular ischemic change or changes from the migraine headaches.  I think demyelination from MS is significantly less likely so I did not think that she should start a disease modifying therapy.Also,   a lumbar puncture was reportedly negative.  There is no family history of MS.   Transient alteration of awareness.   She had a seizure in 2011 and also had an episode of altered awareness in August 2017 the first episode in 2011 did have generalized tonic-clonic activity and occurred while on Wellbutrin and tramadol. . The second spell did not have any precipitating factor though it is uncertain whether it represented a seizure as there was no loss of consciousness.     She went to [facility] emergency room and was referred to Dr. Trula Ore.  The EEG was read as abnormal (right parietal and temporal slowing) and she was started on lamotrigine 100 mg by mouth twice a day. She tolerates it well.  We discussed that it is uncertain about the significance of the EEG findings as a little slowing is not necessarily abnormal though it can be seen in people who have had seizures. Since lamotrigine also could help some other symptoms she will continue on it for the time being.  Migraine:   She has 3 -4 migraines a month, usually improved with  She has a long history of migraine headaches. For the most part, these are common migraine headaches without aura. She will get pounding bilateral pain, photophobia, phonophobia and nausea and sometimes vomiting. Her head will make the pain worse. Imitrex usually helps the pain.   These are actually occurring less since starting lamotrigine.  CTS:   She had a nerve conduction and EMG study performed. It showed that she had the nerve conduction study showed moderate right and mild left carpal tunnel syndrome and no-shows showed mild cervical and lumbosacral radiculopathies.    Anxiety:  She has some anxiety and is on Lexapro.  She tolerates it well.   REVIEW OF SYSTEMS: Constitutional: No fevers, chills, sweats, or change in appetite Eyes: No visual changes, double vision, eye pain Ear, nose and throat: No hearing loss, ear pain, nasal congestion, sore throat Cardiovascular: No chest pain, palpitations Respiratory: No shortness of breath at rest or with exertion.   No wheezes GastrointestinaI: No nausea, vomiting, diarrhea, abdominal pain, fecal incontinence Genitourinary: No dysuria, urinary retention or frequency.  No nocturia. Musculoskeletal: No neck pain, back pain Integumentary: No rash, pruritus, skin lesions Neurological: as above Psychiatric: No depression at this time. She has anxiety Endocrine: No palpitations, diaphoresis, change in appetite, change in weigh or increased thirst Hematologic/Lymphatic: No anemia, purpura, petechiae. Allergic/Immunologic: No itchy/runny eyes, nasal congestion, recent allergic reactions,  rashes  ALLERGIES: Allergies  Allergen Reactions  . Tramadol Other (See Comments)    Seizures     HOME MEDICATIONS:  Current Outpatient Medications:  .  acetaminophen (TYLENOL) 500 MG tablet, Take 1,000 mg by mouth every 6 (six) hours as needed for mild pain. , Disp: , Rfl:  .  aspirin 81 MG chewable tablet, Chew 81 mg by mouth daily., Disp: , Rfl:  .  escitalopram (LEXAPRO) 10 MG tablet, Take 10 mg by mouth daily. , Disp: , Rfl:  .  ibuprofen (ADVIL,MOTRIN) 200 MG tablet, Take 200 mg by mouth every 6 (six) hours as needed for mild pain. , Disp: , Rfl:  .  lamoTRIgine (LAMICTAL) 100 MG tablet, Take 100 mg by mouth 2 (two) times daily., Disp: , Rfl:  .  topiramate (TOPAMAX) 25 MG tablet, Take 1 tablet (25 mg total)  by mouth at bedtime., Disp: 90 tablet, Rfl: 1 .  SUMAtriptan (IMITREX) 100 MG tablet, Take 1 tablet (100 mg total) by mouth once. (Patient taking differently: Take 100 mg by mouth daily. ), Disp: 10 tablet, Rfl: 11  Current Facility-Administered Medications:  .  0.9 %  sodium chloride infusion, 500 mL, Intravenous, Continuous, Armbruster, Carlota Raspberry, MD  PAST MEDICAL HISTORY: Past Medical History:  Diagnosis Date  . Allergy   . Anxiety   . Cancer ([place])   . Headache   . Melanoma ([place])   . Multiple sclerosis ([place])    not sure if so at this point- ? misdiagnosed   . Osteopenia   . Psoriasis   . Seizures ([place])    last seizure 2011 per pt in [facility] 11-27-2016  . Vision abnormalities     PAST SURGICAL HISTORY: Past Surgical History:  Procedure Laterality Date  . BREAST BIOPSY  1993   benign  . MELANOMA EXCISION    . MYOMECTOMY  2012  . wisdom teeth      FAMILY HISTORY: Family History  Problem Relation Age of Onset  . Stroke Mother   . Breast cancer Mother   . Hypertension Father   . High Cholesterol Father   . Psoriasis Father   . Healthy Brother   . Transient ischemic attack Maternal Grandmother   . Esophageal cancer Maternal Grandfather   . Colon cancer Neg  Hx   . Colon polyps Neg Hx   . Rectal cancer Neg Hx   . Stomach cancer Neg Hx     SOCIAL HISTORY:  Social History   Socioeconomic History  . Marital status: Married    Spouse name: Dominica Severin  . Number of children: Not on file  . Years of education: Not on file  . Highest education level: Not on file  Social Needs  . Financial resource strain: Not on file  . Food insecurity - worry: Not on file  . Food insecurity - inability: Not on file  . Transportation needs - medical: Not on file  . Transportation needs - non-medical: Not on file  Occupational History  . Not on file  Tobacco Use  . Smoking status: Former Smoker    Last attempt to quit: 1996    Years since quitting: 23.1  . Smokeless tobacco: Never Used  Substance and Sexual Activity  . Alcohol use: Yes    Comment: occasional  . Drug use: No  . Sexual activity: Not on file  Other Topics Concern  . Not on file  Social History Narrative  . Not on file     PHYSICAL EXAM  Vitals:   03/28/17 1509  BP: 118/78  Pulse: 79  Resp: 16  Weight: 127 lb 8 oz (57.8 kg)    Body mass index is 24.9 kg/m.   General: The patient is well-developed and well-nourished and in no acute distress   Neurologic Exam  Mental status: The patient is alert and oriented x 3 at the time of the examination. The patient has apparent normal recent and remote memory, with an apparently normal attention span and concentration ability.   Speech is normal.  Cranial nerves: Extraocular movements are full.  There is good facial  Strength .  Trapezius and sternocleidomastoid strength is normal. No dysarthria is noted.  The tongue is midline, and the patient has symmetric elevation of the soft palate. No obvious hearing deficits are noted.  Motor:  Muscle bulk is normal.   Tone is normal. Strength  is  5 / 5 in all 4 extremities.    Gait and station: Station is normal.   Gait is normal. Tandem gait is normal. Romberg is negative.   Reflexes: Deep  tendon reflexes are symmetric in the arms.   DTRs were 3+ at the knees but right > left  and 2+ elsewherer.      DIAGNOSTIC DATA (LABS, IMAGING, TESTING) - I reviewed patient records, labs, notes, testing and imaging myself where available.      ASSESSMENT AND PLAN  Transient alteration of awareness - Plan: EEG adult  Migraine without aura and without status migrainosus, not intractable  Weakness of right lower extremity  Shaking - Plan: EEG adult  1.   As the spells with slightly with right sided shaking followed by right foot drop are persisting, we will check an EEG to make sure that there is no epileptiform activity.   2.   rtc prn if any new or worsening neurologic symptoms.  If she has more episodes of leg numbness and weakness, consider an MRI of the lumbar spine to better characterize   Kobe Jansma A. Felecia Shelling, MD, PhD 07/21/6835, 2:90 PM Certified in Neurology, Clinical Neurophysiology, Sleep Medicine, Pain Medicine and Neuroimaging  [facility] [address] [place], [place] [address] [phone]

## 2017-04-08 ENCOUNTER — Ambulatory Visit (INDEPENDENT_AMBULATORY_CARE_PROVIDER_SITE_OTHER): Payer: BLUE CROSS/BLUE SHIELD | Admitting: Neurology

## 2017-04-08 ENCOUNTER — Encounter: Payer: Self-pay | Admitting: Neurology

## 2017-04-08 DIAGNOSIS — G43909 Migraine, unspecified, not intractable, without status migrainosus: Secondary | ICD-10-CM

## 2017-04-08 DIAGNOSIS — R404 Transient alteration of awareness: Secondary | ICD-10-CM

## 2017-04-08 DIAGNOSIS — R251 Tremor, unspecified: Secondary | ICD-10-CM

## 2017-04-08 DIAGNOSIS — F445 Conversion disorder with seizures or convulsions: Secondary | ICD-10-CM

## 2017-04-08 DIAGNOSIS — F411 Generalized anxiety disorder: Secondary | ICD-10-CM

## 2017-04-08 MED ORDER — ESCITALOPRAM OXALATE 20 MG PO TABS: 20.0000 mg | ORAL_TABLET | Freq: Every day | ORAL | 3 refills | Status: DC

## 2017-04-08 NOTE — Progress Notes (Signed)
[facility]  PATIENT: Amy Hopkins DOB: [birth year]  REFERRING DOCTOR OR PCP:  Finis Bud SOURCE: Patient, notes from Dr. Dory Larsen and Dr. Trula Ore, MRI reports, lab reports, MRI images on CD  _________________________________   HISTORICAL  CHIEF COMPLAINT:  No chief complaint on file.   HISTORY OF PRESENT ILLNESS:  Amy Hopkins is a 52 yo woman with spells of transient alteration of awareness and shaking    Update 04/08/2017: Over the past year, she has had about a dozen spells with transient alteration of awareness and shaking, right greater than left.   Additionally, in 2011, she had a generalized tonic-clonic seizure while she was on Wellbutrin and tramadol. In the intervening 6-7 years  she had no episodes.   I reviewed the findings of the EEG. It showed a nonepileptic seizure (pseudoseizure).   I evaluated her during the episode and she had alternating arm shaking at times and just shook the right side of her body at other times. There was distractibility of the motor symptoms. She had preserved consciousness. She was able to talk and follow simple commands.   This spell was similar to the spells that she has been experiencing over the past few months.  She denies any significant stressor currently in life. She also denies a history of physical or sexual abuse. She does note a history of depression and anxiety many years ago when her mother had a stroke and she saw a psychiatrist for short period time she has been on Lexapro 10 mg for many years. She also has migraine headaches and she reports that they are doing well.   Of note, she is on lamotrigine 100 mg by mouth twice a day and Topamax 25 mg daily at bedtime for migraine headaches. Her migraines are doing very well.  Update 03/28/2017: She is having spells of altered consciousness lasting 20 minutes.     She gets right sided shaking.  No LOC and is able to talk though she does not feel completely  baseline with thought.     Her head will shake on the more severe episodes.   They appear to e random without any trigger.    She has had 8 spells since last October.   A few occurred the couple weeks after Christmas and she had more stress but others occurred with typical stress.   Spells come on suddenly but gradually gets better.    She is tired afterward and has a mild foot drop on the right for the rest of the day.     Spells always occur on her right.   Sitting will ease th spell some.   Her first spell was one year ago.   She is on lamotrigine and topiramate (migraines).      In the past, MRI showed some non-specific foci but CSF was normal.      Migraines are doing well.      From 11/07/2016:  She went to the ED 09/27/16 after presenting with right leg shaking, much worse when standing.   She also felt weaker on her right leg and was dragging it some.    I looked at a video that she took. She was standing at the time. It did not look like seizure activity. She went to the ED and had an MRI performed.   I personally looked at the images and compared to the MRI from December 2017. The current MRI shows C3C4 (mildly worse than previous MRI), C5C6 and  C6C7 spinal stenosis.   There is mild anterolisthesis at C7T1.    The spinal cord appears normal with no evidence of MS or myelopathy.  Numbness/weakness:   Her right leg gets numbness up to the thigh.   The numbness comes and goes.   She feels strength is reduced intermittently, noting more weakness when she also has numbness.     She has mild urinary frequency but no nocturia.     Bending her neck does not exacerbate symptoms.      Migraines:   Her migraines are much better on Topamax 25 mg nightly    Abnormal MRI/Possible MS:  Because of a spell of transient alteration of awareness in August 2017, she had an MRI.   The MRI shows nonspecific white matter changes slightly progressed her to the 2011 MRI.  He was felt by one neurologist to have MS scribe  Copaxone but never took it. She was then referred to me for second opinion. I personally reviewed the MRIs.    I believe that the changes are most likely to represent chronic microvascular ischemic change or changes from the migraine headaches. I think demyelination from MS is significantly less likely so I did not think that she should start a disease modifying therapy.Also,   a lumbar puncture was reportedly negative.  There is no family history of MS.   Transient alteration of awareness.   She had a seizure in 2011 and also had an episode of altered awareness in August 2017 the first episode in 2011 did have generalized tonic-clonic activity and occurred while on Wellbutrin and tramadol. . The second spell did not have any precipitating factor though it is uncertain whether it represented a seizure as there was no loss of consciousness.     She went to [facility] emergency room and was referred to Dr. Trula Ore.  The EEG was read as abnormal (right parietal and temporal slowing) and she was started on lamotrigine 100 mg by mouth twice a day. She tolerates it well. We discussed that it is uncertain about the significance of the EEG findings as a little slowing is not necessarily abnormal though it can be seen in people who have had seizures. Since lamotrigine also could help some other symptoms she will continue on it for the time being.  Migraine:   She has 3 -4 migraines a month, usually improved with  She has a long history of migraine headaches. For the most part, these are common migraine headaches without aura. She will get pounding bilateral pain, photophobia, phonophobia and nausea and sometimes vomiting. Her head will make the pain worse. Imitrex usually helps the pain.   These are actually occurring less since starting lamotrigine.  CTS:   She had a nerve conduction and EMG study performed. It showed that she had the nerve conduction study showed moderate right and mild left carpal tunnel syndrome and  no-shows showed mild cervical and lumbosacral radiculopathies.    Anxiety:  She has some anxiety and is on Lexapro.  She tolerates it well.   REVIEW OF SYSTEMS: Constitutional: No fevers, chills, sweats, or change in appetite Eyes: No visual changes, double vision, eye pain Ear, nose and throat: No hearing loss, ear pain, nasal congestion, sore throat Cardiovascular: No chest pain, palpitations Respiratory: No shortness of breath at rest or with exertion.   No wheezes GastrointestinaI: No nausea, vomiting, diarrhea, abdominal pain, fecal incontinence Genitourinary: No dysuria, urinary retention or frequency.  No nocturia. Musculoskeletal: No neck pain,  back pain Integumentary: No rash, pruritus, skin lesions Neurological: as above Psychiatric: No depression at this time. She has anxiety Endocrine: No palpitations, diaphoresis, change in appetite, change in weigh or increased thirst Hematologic/Lymphatic: No anemia, purpura, petechiae. Allergic/Immunologic: No itchy/runny eyes, nasal congestion, recent allergic reactions, rashes  ALLERGIES: Allergies  Allergen Reactions  . Tramadol Other (See Comments)    Seizures     HOME MEDICATIONS:  Current Outpatient Medications:  .  acetaminophen (TYLENOL) 500 MG tablet, Take 1,000 mg by mouth every 6 (six) hours as needed for mild pain. , Disp: , Rfl:  .  aspirin 81 MG chewable tablet, Chew 81 mg by mouth daily., Disp: , Rfl:  .  escitalopram (LEXAPRO) 20 MG tablet, Take 1 tablet (20 mg total) by mouth daily., Disp: 90 tablet, Rfl: 3 .  ibuprofen (ADVIL,MOTRIN) 200 MG tablet, Take 200 mg by mouth every 6 (six) hours as needed for mild pain. , Disp: , Rfl:  .  lamoTRIgine (LAMICTAL) 100 MG tablet, Take 100 mg by mouth 2 (two) times daily., Disp: , Rfl:  .  SUMAtriptan (IMITREX) 100 MG tablet, Take 1 tablet (100 mg total) by mouth once. (Patient taking differently: Take 100 mg by mouth daily. ), Disp: 10 tablet, Rfl: 11 .  topiramate  (TOPAMAX) 25 MG tablet, Take 1 tablet (25 mg total) by mouth at bedtime., Disp: 90 tablet, Rfl: 1  Current Facility-Administered Medications:  .  0.9 %  sodium chloride infusion, 500 mL, Intravenous, Continuous, Armbruster, Carlota Raspberry, MD  PAST MEDICAL HISTORY: Past Medical History:  Diagnosis Date  . Allergy   . Anxiety   . Cancer ([place])   . Headache   . Melanoma ([place])   . Multiple sclerosis ([place])    not sure if so at this point- ? misdiagnosed   . Osteopenia   . Psoriasis   . Seizures ([place])    last seizure 2011 per pt in [facility] 11-27-2016  . Vision abnormalities     PAST SURGICAL HISTORY: Past Surgical History:  Procedure Laterality Date  . BREAST BIOPSY  1993   benign  . MELANOMA EXCISION    . MYOMECTOMY  2012  . wisdom teeth      FAMILY HISTORY: Family History  Problem Relation Age of Onset  . Stroke Mother   . Breast cancer Mother   . Hypertension Father   . High Cholesterol Father   . Psoriasis Father   . Healthy Brother   . Transient ischemic attack Maternal Grandmother   . Esophageal cancer Maternal Grandfather   . Colon cancer Neg Hx   . Colon polyps Neg Hx   . Rectal cancer Neg Hx   . Stomach cancer Neg Hx     SOCIAL HISTORY:  Social History   Socioeconomic History  . Marital status: Married    Spouse name: Dominica Severin  . Number of children: Not on file  . Years of education: Not on file  . Highest education level: Not on file  Social Needs  . Financial resource strain: Not on file  . Food insecurity - worry: Not on file  . Food insecurity - inability: Not on file  . Transportation needs - medical: Not on file  . Transportation needs - non-medical: Not on file  Occupational History  . Not on file  Tobacco Use  . Smoking status: Former Smoker    Last attempt to quit: 1996    Years since quitting: 23.1  . Smokeless tobacco: Never Used  Substance and  Sexual Activity  . Alcohol use: Yes    Comment: occasional  . Drug use: No  . Sexual activity: Not  on file  Other Topics Concern  . Not on file  Social History Narrative  . Not on file     PHYSICAL EXAM  There were no vitals filed for this visit.  There is no height or weight on file to calculate BMI.   General: The patient is well-developed and well-nourished and in no acute distress   Neurologic Exam:  Spell:  I witnessed half of her bowel that she felt was similar to what she has been experiencing over the past few months. When I first walked in she had alternating jerking on the right and left side of fairly symmetric amplitude. She was conscious and was able to answer simple questions. I place my hand on her left arm and she stopped shaking on that side and continued to shake on the right side though there was some distractibility and changes in frequency.   After reassurance that this was not an epileptic seizure I set her up and over the next several minutes her symptoms completely resolved.  Mental status: The patient is alert and oriented x 3 at the time of the examination. The patient has apparent normal recent and remote memory, with an apparently normal attention span and concentration ability.   Speech is normal.  Cranial nerves: Extraocular movements are full.  There is good facial strength .  She had mild dysarthria initially but this cleared after she sat up.  The tongue is midline, and the patient has symmetric elevation of the soft palate. No obvious hearing deficits are noted.  Motor:  Muscle bulk is normal.   Tone is normal. Strength appeared to be normal and symmetric.    Gait and station: Station is normal.   Gait was normal after the episode..   Reflexes: Deep tendon reflexes are symmetric in the arms.         DIAGNOSTIC DATA (LABS, IMAGING, TESTING) - I reviewed patient records, labs, notes, testing and imaging myself where available.      ASSESSMENT AND PLAN  Transient alteration of awareness  Psychogenic nonepileptic  seizure  Shaking  Migraine without status migrainosus, not intractable, unspecified migraine type  Anxiety state   1.   I had a long discussion with her and her husband about the significance of my exam and the EEG. She most likely has psychogenic nonepileptic seizures. 2.    I will increase her Lexapro to 20 mg 3.    Will be referred to psychiatry for further evaluation and treatment 4.    Return to clinic in 4 months or sooner if there are new or worsening  neurologic symptoms    Kipp Shank A. Felecia Shelling, MD, PhD 2/77/4128, 7:86 PM Certified in Neurology, Clinical Neurophysiology, Sleep Medicine, Pain Medicine and Neuroimaging  [facility] [address] [place], [place] [address] [phone]

## 2017-04-08 NOTE — Progress Notes (Signed)
   [facility]  EEG (ELECTROENCEPHALOGRAM) REPORT   STUDY DATE: 04/08/2017 PATIENT NAME: Amy Hopkins DOB: 1965/07/19 MRN: [id number]  ORDERING CLINICIAN: Even Budlong A. Felecia Shelling, MD. PhD  TECHNOLOGIST: Laretta Alstrom TECHNIQUE: Electroencephalogram was recorded utilizing standard 10-20 system of lead placement and reformatted into average and bipolar montages.  RECORDING TIME: 22 minutes  CLINICAL INFORMATION: 52 year old woman with episodes of transient alteration of awareness and shaking  FINDINGS: A digital EEG was performed while the patient was awake and drowsy. Was increased alpha and while awake and most alert there was a 12 hz posterior dominant rhythm. Voltages and frequencies were symmetric.  There were no focal, lateralizing, epileptiform activity or seizures seen initially.  Photic stimulation had a normal driving response. Hyperventilation did not change the underlying rhythms. Shortly after 3 minutes of hyperventilation, she started to shake the right side of her body and then both sides of her body she was able to follow commands and speak.  There is no definite electrical correlate to the spell initially. Tremor artifact was first noted and then she had more violent movement artifact and several of the leads detached.    This lasted an additional 15 minutes. At times, she was shaking both sides with an alternating pattern and at other times just the right side. The was somewhat distractible. She was able to speak and follow commands. His symptoms resolved slowly over a few more minutes after she was placed in the seated position. There was no weakness afterwards.  IMPRESSION: This is an abnormal EEG while the patient was awake. It captured an event most consistent with a non-epileptiform seizure.   INTERPRETING PHYSICIAN:   Anayelli Lai A. Felecia Shelling, MD, PhD, FAAN Certified in Neurology, Clinical Neurophysiology, Sleep Medicine, Pain Medicine and  Neuroimaging  Clinical note: I spoke with her and her husband after the study to let them know the findings. I'll increase her Lexapro from 10 mg to 20 mg and will refer her to psychiatry --RAS  [facility] [address], [place], [place] [address] [phone]

## 2017-04-23 DIAGNOSIS — Z1231 Encounter for screening mammogram for malignant neoplasm of breast: Secondary | ICD-10-CM | POA: Diagnosis not present

## 2017-04-23 DIAGNOSIS — M4802 Spinal stenosis, cervical region: Secondary | ICD-10-CM | POA: Diagnosis not present

## 2017-04-23 DIAGNOSIS — Z0000 Encounter for general adult medical examination without abnormal findings: Secondary | ICD-10-CM | POA: Diagnosis not present

## 2017-04-23 DIAGNOSIS — G43909 Migraine, unspecified, not intractable, without status migrainosus: Secondary | ICD-10-CM | POA: Diagnosis not present

## 2017-04-23 DIAGNOSIS — E559 Vitamin D deficiency, unspecified: Secondary | ICD-10-CM | POA: Diagnosis not present

## 2017-04-23 DIAGNOSIS — E7800 Pure hypercholesterolemia, unspecified: Secondary | ICD-10-CM | POA: Diagnosis not present

## 2017-04-29 ENCOUNTER — Ambulatory Visit (INDEPENDENT_AMBULATORY_CARE_PROVIDER_SITE_OTHER): Payer: PRIVATE HEALTH INSURANCE | Admitting: Psychiatry

## 2017-04-29 ENCOUNTER — Other Ambulatory Visit: Payer: Self-pay

## 2017-04-29 ENCOUNTER — Encounter (HOSPITAL_COMMUNITY): Payer: Self-pay | Admitting: Psychiatry

## 2017-04-29 VITALS — BP 104/78 | HR 78 | Ht 60.0 in | Wt 126.0 lb

## 2017-04-29 DIAGNOSIS — F419 Anxiety disorder, unspecified: Secondary | ICD-10-CM | POA: Diagnosis not present

## 2017-04-29 DIAGNOSIS — G35 Multiple sclerosis: Secondary | ICD-10-CM

## 2017-04-29 DIAGNOSIS — F324 Major depressive disorder, single episode, in partial remission: Secondary | ICD-10-CM

## 2017-04-29 DIAGNOSIS — R251 Tremor, unspecified: Secondary | ICD-10-CM

## 2017-04-29 DIAGNOSIS — Z87891 Personal history of nicotine dependence: Secondary | ICD-10-CM | POA: Diagnosis not present

## 2017-04-29 NOTE — Progress Notes (Signed)
Psychiatric Initial Adult Assessment   Patient Identification: Amy Hopkins MRN:  [id number] Date of Evaluation:  04/29/2017 Referral Source: [facility] Chief Complaint:   Chief Complaint    Establish Care; Anxiety     Visit Diagnosis:    ICD-10-CM   1. Major depressive disorder with single episode, in partial remission ([place]) F32.4   2. Anxiety disorder, unspecified type F41.9 B12 and Folate Panel  3. Episode of shaking R25.1 B12 and Folate Panel    History of Present Illness:  52 years old WM female. Referred for assessment of depression, anxiety with possible shaking of body  Patient has been having right-sided arm shaking or shaking movements sporadicallyhas been having or episodically right-sided she has seen her primary care and then a neurologist 1 of the neurologist diagnosed with multiple sclerosis she did not feel comfortable with the diagnosis she has seen another 1 arm shaking or shaking movements sporadically and currently is on Lamictal and Topamax for possible seizure or episodically she has seen a primary care and then a neurologist to one of the neurologist diagnosed with multiple sclerosis she did not feel comfortable with the diagnosis chasing another one or the seizure may be related with anxiety or psychogenic and currently is on Lamictal and Topamax for possible seizure or the seizure may be related with anxiety with psychogenic etiology as a possibility but currently she denies depression, excessive worries. She likes her job . Sleeps fair and has good support system. She does not most hopelessness depression or significant anxiety symptoms  Her main stress is her mom who got a stroke. She feels her depression is rasonablly controlled on lexapro  Her main stress is her mom forgot a stroke 30 years ago and then patient took over the caregiving role and still visits her mom 2-3 times a week 30 years ago and then patient took over that caregiving role and  still visits her mom 2 or 3 times a week she feels concerned when she sees her in a locked position concerned when she sees her in the locked position of not able to express . She is paralyzed as well uses computer to express .   Modifying factor: supportive husband, step daughter. Likes her job Denies any other anxiety or trauma current or in past  Says started on meds when her mom was diagnosed with stroke and she went to depression, anxiety dealing with it.   Duration ; more then 20 years  severity of anxiety currently not worse   She felt unbalanced with eyes closed. Otherwise no shaking or movements noticeable  Primary care and neurologist have done most all work up and CT . Says comes back normal Will write b   associated Signs/Symptoms: Depression Symptoms:  Sadness episodic (Hypo) Manic Symptoms:  Distractibility, Anxiety Symptoms:  Excessive Worry, at times  Psychotic Symptoms:  denies PTSD Symptoms: Had a traumatic exposure:  mom had stroke at her early age  Past Psychiatric History: anxiety  Previous Psychotropic Medications: No   Substance Abuse History in the last 12 months:  No.  Consequences of Substance Abuse: NA  Past Medical History:  Past Medical History:  Diagnosis Date  . Allergy   . Anxiety   . Cancer ([place])   . Headache   . Melanoma ([place])   . Multiple sclerosis ([place])    not sure if so at this point- ? misdiagnosed   . Osteopenia   . Psoriasis   . Seizures ([place])  last seizure 2011 per pt in [facility] 11-27-2016  . Vision abnormalities     Past Surgical History:  Procedure Laterality Date  . BREAST BIOPSY  1993   benign  . MELANOMA EXCISION    . MYOMECTOMY  2012  . wisdom teeth      Family Psychiatric History: denies   Family History:  Family History  Problem Relation Age of Onset  . Stroke Mother   . Breast cancer Mother   . Hypertension Father   . High Cholesterol Father   . Psoriasis Father   . Healthy Brother   . Transient  ischemic attack Maternal Grandmother   . Esophageal cancer Maternal Grandfather   . Colon cancer Neg Hx   . Colon polyps Neg Hx   . Rectal cancer Neg Hx   . Stomach cancer Neg Hx     Social History:   Social History   Socioeconomic History  . Marital status: Married    Spouse name: Dominica Severin  . Number of children: None  . Years of education: None  . Highest education level: None  Social Needs  . Financial resource strain: None  . Food insecurity - worry: None  . Food insecurity - inability: None  . Transportation needs - medical: None  . Transportation needs - non-medical: None  Occupational History  . None  Tobacco Use  . Smoking status: Former Smoker    Last attempt to quit: 1996    Years since quitting: 23.2  . Smokeless tobacco: Never Used  Substance and Sexual Activity  . Alcohol use: Yes    Comment: occasional  . Drug use: No  . Sexual activity: Yes  Other Topics Concern  . None  Social History Narrative  . None    Additional Social History: grew up with parents. Good childhood. No trauma. Married for 7 years and works FT   Allergies:   Allergies  Allergen Reactions  . Tramadol Other (See Comments)    Seizures     Metabolic Disorder Labs: Lab Results  Component Value Date   HGBA1C 5.1 09/28/2016   MPG 99.67 09/28/2016   No results found for: PROLACTIN Lab Results  Component Value Date   CHOL 184 09/28/2016   TRIG 36 09/28/2016   HDL 58 09/28/2016   CHOLHDL 3.2 09/28/2016   VLDL 7 09/28/2016   LDLCALC 119 (H) 09/28/2016     Current Medications: Current Outpatient Medications  Medication Sig Dispense Refill  . acetaminophen (TYLENOL) 500 MG tablet Take 1,000 mg by mouth every 6 (six) hours as needed for mild pain.     Marland Kitchen aspirin 81 MG chewable tablet Chew 81 mg by mouth daily.    Marland Kitchen escitalopram (LEXAPRO) 20 MG tablet Take 1 tablet (20 mg total) by mouth daily. 90 tablet 3  . ibuprofen (ADVIL,MOTRIN) 200 MG tablet Take 200 mg by mouth every 6  (six) hours as needed for mild pain.     Marland Kitchen lamoTRIgine (LAMICTAL) 100 MG tablet Take 100 mg by mouth 2 (two) times daily.    . SUMAtriptan (IMITREX) 100 MG tablet Take 1 tablet (100 mg total) by mouth once. (Patient taking differently: Take 100 mg by mouth daily. ) 10 tablet 11  . topiramate (TOPAMAX) 25 MG tablet Take 1 tablet (25 mg total) by mouth at bedtime. 90 tablet 1  . escitalopram (LEXAPRO) 10 MG tablet Take by mouth.     Current Facility-Administered Medications  Medication Dose Route Frequency Provider Last Rate Last Dose  . 0.9 %  sodium chloride infusion  500 mL Intravenous Continuous Armbruster, Carlota Raspberry, MD        Neurologic: Headache: No Seizure: some episodics shaking episdoes Paresthesias:No  Musculoskeletal: Strength & Muscle Tone: within normal limits Gait & Station: normal Patient leans: none  Psychiatric Specialty Exam: Review of Systems  Cardiovascular: Negative for chest pain.  Skin: Negative for rash.  Psychiatric/Behavioral: Negative for depression and substance abuse.    Blood pressure 104/78, pulse 78, height 5' (1.524 m), weight 126 lb (57.2 kg).Body mass index is 24.61 kg/m.  General Appearance: Casual  Eye Contact:  Fair  Speech:  Normal Rate  Volume:  Normal  Mood:  Euthymic  Affect:  Congruent  Thought Process:  Goal Directed  Orientation:  Full (Time, Place, and Person)  Thought Content:  Logical  Suicidal Thoughts:  No  Homicidal Thoughts:  No  Memory:  Immediate;   Fair Recent;   Fair  Judgement:  Fair  Insight:  Fair  Psychomotor Activity:  Normal  Concentration:  Concentration: Good and Attention Span: Fair  Recall:  Good  Fund of Knowledge:Good  Language: Good  Akathisia:  No  Handed:  Right  AIMS (if indicated):  0  Assets:  Desire for Improvement  ADL's:  Intact  Cognition: WNL  Sleep:  fair    Treatment Plan Summary: Medication management and Plan as follows  1. Major depression, partial remission: not worse.  Continue lexapro 2. Anxiety unspecified possible related to her moms sickness  has good support of her own.  Can consider therapy but feels she gets sad when she thinks of her mom condition. This is not excessive or she suppress it No past other trauma 3. Shaking of body: episodice. On lamictal and topomax. These can also be contributing as mood stabilizers and helping some depression.  dont think need for adding another anti depressant for now Can consider to decrease dose of lamictal if neurology agrees  Will write for B12 folate levels or can get by primary care office  FU 6 weeks for any further questions . Patient also not interested in therapy or adding more meds. lexapro seems fair in helping depression    Merian Capron, MD 3/11/201911:27 AM

## 2017-05-06 DIAGNOSIS — M9901 Segmental and somatic dysfunction of cervical region: Secondary | ICD-10-CM | POA: Diagnosis not present

## 2017-05-06 DIAGNOSIS — M531 Cervicobrachial syndrome: Secondary | ICD-10-CM | POA: Diagnosis not present

## 2017-05-06 DIAGNOSIS — M5441 Lumbago with sciatica, right side: Secondary | ICD-10-CM | POA: Diagnosis not present

## 2017-05-06 DIAGNOSIS — M9903 Segmental and somatic dysfunction of lumbar region: Secondary | ICD-10-CM | POA: Diagnosis not present

## 2017-05-08 DIAGNOSIS — M5441 Lumbago with sciatica, right side: Secondary | ICD-10-CM | POA: Diagnosis not present

## 2017-05-08 DIAGNOSIS — M9901 Segmental and somatic dysfunction of cervical region: Secondary | ICD-10-CM | POA: Diagnosis not present

## 2017-05-08 DIAGNOSIS — M531 Cervicobrachial syndrome: Secondary | ICD-10-CM | POA: Diagnosis not present

## 2017-05-08 DIAGNOSIS — M9903 Segmental and somatic dysfunction of lumbar region: Secondary | ICD-10-CM | POA: Diagnosis not present

## 2017-05-14 DIAGNOSIS — M531 Cervicobrachial syndrome: Secondary | ICD-10-CM | POA: Diagnosis not present

## 2017-05-14 DIAGNOSIS — M9901 Segmental and somatic dysfunction of cervical region: Secondary | ICD-10-CM | POA: Diagnosis not present

## 2017-05-14 DIAGNOSIS — M5441 Lumbago with sciatica, right side: Secondary | ICD-10-CM | POA: Diagnosis not present

## 2017-05-14 DIAGNOSIS — M9903 Segmental and somatic dysfunction of lumbar region: Secondary | ICD-10-CM | POA: Diagnosis not present

## 2017-05-18 DIAGNOSIS — Z1231 Encounter for screening mammogram for malignant neoplasm of breast: Secondary | ICD-10-CM | POA: Diagnosis not present

## 2017-05-30 DIAGNOSIS — M9901 Segmental and somatic dysfunction of cervical region: Secondary | ICD-10-CM | POA: Diagnosis not present

## 2017-05-30 DIAGNOSIS — M9903 Segmental and somatic dysfunction of lumbar region: Secondary | ICD-10-CM | POA: Diagnosis not present

## 2017-05-30 DIAGNOSIS — M531 Cervicobrachial syndrome: Secondary | ICD-10-CM | POA: Diagnosis not present

## 2017-05-30 DIAGNOSIS — M5441 Lumbago with sciatica, right side: Secondary | ICD-10-CM | POA: Diagnosis not present

## 2017-06-01 ENCOUNTER — Other Ambulatory Visit: Payer: Self-pay | Admitting: Neurology

## 2017-06-06 DIAGNOSIS — M531 Cervicobrachial syndrome: Secondary | ICD-10-CM | POA: Diagnosis not present

## 2017-06-06 DIAGNOSIS — M9901 Segmental and somatic dysfunction of cervical region: Secondary | ICD-10-CM | POA: Diagnosis not present

## 2017-06-06 DIAGNOSIS — M9903 Segmental and somatic dysfunction of lumbar region: Secondary | ICD-10-CM | POA: Diagnosis not present

## 2017-06-06 DIAGNOSIS — M5441 Lumbago with sciatica, right side: Secondary | ICD-10-CM | POA: Diagnosis not present

## 2017-06-10 ENCOUNTER — Ambulatory Visit (HOSPITAL_COMMUNITY): Payer: Self-pay | Admitting: Psychiatry

## 2017-06-18 DIAGNOSIS — M9901 Segmental and somatic dysfunction of cervical region: Secondary | ICD-10-CM | POA: Diagnosis not present

## 2017-06-18 DIAGNOSIS — M5441 Lumbago with sciatica, right side: Secondary | ICD-10-CM | POA: Diagnosis not present

## 2017-06-18 DIAGNOSIS — M9903 Segmental and somatic dysfunction of lumbar region: Secondary | ICD-10-CM | POA: Diagnosis not present

## 2017-06-18 DIAGNOSIS — M531 Cervicobrachial syndrome: Secondary | ICD-10-CM | POA: Diagnosis not present

## 2017-07-09 DIAGNOSIS — M9901 Segmental and somatic dysfunction of cervical region: Secondary | ICD-10-CM | POA: Diagnosis not present

## 2017-07-09 DIAGNOSIS — M9903 Segmental and somatic dysfunction of lumbar region: Secondary | ICD-10-CM | POA: Diagnosis not present

## 2017-07-09 DIAGNOSIS — M531 Cervicobrachial syndrome: Secondary | ICD-10-CM | POA: Diagnosis not present

## 2017-07-09 DIAGNOSIS — M5441 Lumbago with sciatica, right side: Secondary | ICD-10-CM | POA: Diagnosis not present

## 2017-07-23 DIAGNOSIS — M531 Cervicobrachial syndrome: Secondary | ICD-10-CM | POA: Diagnosis not present

## 2017-07-23 DIAGNOSIS — M9901 Segmental and somatic dysfunction of cervical region: Secondary | ICD-10-CM | POA: Diagnosis not present

## 2017-07-23 DIAGNOSIS — M9903 Segmental and somatic dysfunction of lumbar region: Secondary | ICD-10-CM | POA: Diagnosis not present

## 2017-07-23 DIAGNOSIS — M5441 Lumbago with sciatica, right side: Secondary | ICD-10-CM | POA: Diagnosis not present

## 2017-08-08 DIAGNOSIS — M9901 Segmental and somatic dysfunction of cervical region: Secondary | ICD-10-CM | POA: Diagnosis not present

## 2017-08-08 DIAGNOSIS — M5441 Lumbago with sciatica, right side: Secondary | ICD-10-CM | POA: Diagnosis not present

## 2017-08-08 DIAGNOSIS — M531 Cervicobrachial syndrome: Secondary | ICD-10-CM | POA: Diagnosis not present

## 2017-08-08 DIAGNOSIS — M9903 Segmental and somatic dysfunction of lumbar region: Secondary | ICD-10-CM | POA: Diagnosis not present

## 2017-08-27 DIAGNOSIS — L700 Acne vulgaris: Secondary | ICD-10-CM | POA: Diagnosis not present

## 2017-08-27 DIAGNOSIS — D1801 Hemangioma of skin and subcutaneous tissue: Secondary | ICD-10-CM | POA: Diagnosis not present

## 2017-08-27 DIAGNOSIS — L814 Other melanin hyperpigmentation: Secondary | ICD-10-CM | POA: Diagnosis not present

## 2017-08-27 DIAGNOSIS — L400 Psoriasis vulgaris: Secondary | ICD-10-CM | POA: Diagnosis not present

## 2017-08-27 DIAGNOSIS — R238 Other skin changes: Secondary | ICD-10-CM | POA: Diagnosis not present

## 2017-08-27 DIAGNOSIS — L989 Disorder of the skin and subcutaneous tissue, unspecified: Secondary | ICD-10-CM | POA: Diagnosis not present

## 2017-08-27 DIAGNOSIS — C44712 Basal cell carcinoma of skin of right lower limb, including hip: Secondary | ICD-10-CM | POA: Diagnosis not present

## 2017-08-27 DIAGNOSIS — D485 Neoplasm of uncertain behavior of skin: Secondary | ICD-10-CM | POA: Diagnosis not present

## 2017-09-02 DIAGNOSIS — M5441 Lumbago with sciatica, right side: Secondary | ICD-10-CM | POA: Diagnosis not present

## 2017-09-02 DIAGNOSIS — M9901 Segmental and somatic dysfunction of cervical region: Secondary | ICD-10-CM | POA: Diagnosis not present

## 2017-09-02 DIAGNOSIS — M531 Cervicobrachial syndrome: Secondary | ICD-10-CM | POA: Diagnosis not present

## 2017-09-02 DIAGNOSIS — M9903 Segmental and somatic dysfunction of lumbar region: Secondary | ICD-10-CM | POA: Diagnosis not present

## 2017-09-18 DIAGNOSIS — C44729 Squamous cell carcinoma of skin of left lower limb, including hip: Secondary | ICD-10-CM | POA: Diagnosis not present

## 2017-09-18 DIAGNOSIS — L989 Disorder of the skin and subcutaneous tissue, unspecified: Secondary | ICD-10-CM | POA: Diagnosis not present

## 2017-09-18 DIAGNOSIS — D485 Neoplasm of uncertain behavior of skin: Secondary | ICD-10-CM | POA: Diagnosis not present

## 2017-09-18 DIAGNOSIS — D0372 Melanoma in situ of left lower limb, including hip: Secondary | ICD-10-CM | POA: Diagnosis not present

## 2017-09-18 DIAGNOSIS — C44712 Basal cell carcinoma of skin of right lower limb, including hip: Secondary | ICD-10-CM | POA: Diagnosis not present

## 2017-10-07 DIAGNOSIS — M9903 Segmental and somatic dysfunction of lumbar region: Secondary | ICD-10-CM | POA: Diagnosis not present

## 2017-10-07 DIAGNOSIS — M5441 Lumbago with sciatica, right side: Secondary | ICD-10-CM | POA: Diagnosis not present

## 2017-10-07 DIAGNOSIS — M531 Cervicobrachial syndrome: Secondary | ICD-10-CM | POA: Diagnosis not present

## 2017-10-07 DIAGNOSIS — M9901 Segmental and somatic dysfunction of cervical region: Secondary | ICD-10-CM | POA: Diagnosis not present

## 2017-10-16 DIAGNOSIS — M5441 Lumbago with sciatica, right side: Secondary | ICD-10-CM | POA: Diagnosis not present

## 2017-10-16 DIAGNOSIS — M9901 Segmental and somatic dysfunction of cervical region: Secondary | ICD-10-CM | POA: Diagnosis not present

## 2017-10-16 DIAGNOSIS — M531 Cervicobrachial syndrome: Secondary | ICD-10-CM | POA: Diagnosis not present

## 2017-10-16 DIAGNOSIS — M9903 Segmental and somatic dysfunction of lumbar region: Secondary | ICD-10-CM | POA: Diagnosis not present

## 2017-10-24 DIAGNOSIS — M5441 Lumbago with sciatica, right side: Secondary | ICD-10-CM | POA: Diagnosis not present

## 2017-10-24 DIAGNOSIS — M531 Cervicobrachial syndrome: Secondary | ICD-10-CM | POA: Diagnosis not present

## 2017-10-24 DIAGNOSIS — M9903 Segmental and somatic dysfunction of lumbar region: Secondary | ICD-10-CM | POA: Diagnosis not present

## 2017-10-24 DIAGNOSIS — M9901 Segmental and somatic dysfunction of cervical region: Secondary | ICD-10-CM | POA: Diagnosis not present

## 2017-10-28 DIAGNOSIS — E7800 Pure hypercholesterolemia, unspecified: Secondary | ICD-10-CM | POA: Diagnosis not present

## 2017-10-28 DIAGNOSIS — E559 Vitamin D deficiency, unspecified: Secondary | ICD-10-CM | POA: Diagnosis not present

## 2017-10-28 DIAGNOSIS — L309 Dermatitis, unspecified: Secondary | ICD-10-CM | POA: Diagnosis not present

## 2017-10-28 DIAGNOSIS — F4323 Adjustment disorder with mixed anxiety and depressed mood: Secondary | ICD-10-CM | POA: Diagnosis not present

## 2017-10-28 DIAGNOSIS — Z23 Encounter for immunization: Secondary | ICD-10-CM | POA: Diagnosis not present

## 2017-11-01 MED ORDER — LAMOTRIGINE 100 MG PO TABS: 100.0000 mg | ORAL_TABLET | Freq: Two times a day (BID) | ORAL | 1 refills | Status: DC

## 2017-11-11 DIAGNOSIS — M9903 Segmental and somatic dysfunction of lumbar region: Secondary | ICD-10-CM | POA: Diagnosis not present

## 2017-11-11 DIAGNOSIS — M5441 Lumbago with sciatica, right side: Secondary | ICD-10-CM | POA: Diagnosis not present

## 2017-11-11 DIAGNOSIS — M531 Cervicobrachial syndrome: Secondary | ICD-10-CM | POA: Diagnosis not present

## 2017-11-11 DIAGNOSIS — M9901 Segmental and somatic dysfunction of cervical region: Secondary | ICD-10-CM | POA: Diagnosis not present

## 2017-11-21 DIAGNOSIS — M9901 Segmental and somatic dysfunction of cervical region: Secondary | ICD-10-CM | POA: Diagnosis not present

## 2017-11-21 DIAGNOSIS — M5441 Lumbago with sciatica, right side: Secondary | ICD-10-CM | POA: Diagnosis not present

## 2017-11-21 DIAGNOSIS — M531 Cervicobrachial syndrome: Secondary | ICD-10-CM | POA: Diagnosis not present

## 2017-11-21 DIAGNOSIS — M9903 Segmental and somatic dysfunction of lumbar region: Secondary | ICD-10-CM | POA: Diagnosis not present

## 2017-11-26 DIAGNOSIS — M5441 Lumbago with sciatica, right side: Secondary | ICD-10-CM | POA: Diagnosis not present

## 2017-11-26 DIAGNOSIS — M9901 Segmental and somatic dysfunction of cervical region: Secondary | ICD-10-CM | POA: Diagnosis not present

## 2017-11-26 DIAGNOSIS — M9903 Segmental and somatic dysfunction of lumbar region: Secondary | ICD-10-CM | POA: Diagnosis not present

## 2017-11-26 DIAGNOSIS — M531 Cervicobrachial syndrome: Secondary | ICD-10-CM | POA: Diagnosis not present

## 2017-11-27 ENCOUNTER — Other Ambulatory Visit: Payer: Self-pay | Admitting: Neurology

## 2017-12-16 DIAGNOSIS — M9901 Segmental and somatic dysfunction of cervical region: Secondary | ICD-10-CM | POA: Diagnosis not present

## 2017-12-16 DIAGNOSIS — M5441 Lumbago with sciatica, right side: Secondary | ICD-10-CM | POA: Diagnosis not present

## 2017-12-16 DIAGNOSIS — M531 Cervicobrachial syndrome: Secondary | ICD-10-CM | POA: Diagnosis not present

## 2017-12-16 DIAGNOSIS — M9903 Segmental and somatic dysfunction of lumbar region: Secondary | ICD-10-CM | POA: Diagnosis not present

## 2017-12-17 ENCOUNTER — Encounter: Payer: Self-pay | Admitting: Neurology

## 2017-12-17 ENCOUNTER — Ambulatory Visit (INDEPENDENT_AMBULATORY_CARE_PROVIDER_SITE_OTHER): Payer: BLUE CROSS/BLUE SHIELD | Admitting: Neurology

## 2017-12-17 ENCOUNTER — Other Ambulatory Visit: Payer: Self-pay

## 2017-12-17 VITALS — BP 117/80 | HR 68 | Ht 60.0 in | Wt 131.0 lb

## 2017-12-17 DIAGNOSIS — F329 Major depressive disorder, single episode, unspecified: Secondary | ICD-10-CM | POA: Diagnosis not present

## 2017-12-17 DIAGNOSIS — G43909 Migraine, unspecified, not intractable, without status migrainosus: Secondary | ICD-10-CM

## 2017-12-17 DIAGNOSIS — R251 Tremor, unspecified: Secondary | ICD-10-CM

## 2017-12-17 DIAGNOSIS — F445 Conversion disorder with seizures or convulsions: Secondary | ICD-10-CM

## 2017-12-17 DIAGNOSIS — F32A Depression, unspecified: Secondary | ICD-10-CM

## 2017-12-17 MED ORDER — SUMATRIPTAN SUCCINATE 100 MG PO TABS: 100.0000 mg | ORAL_TABLET | Freq: Once | ORAL | 2 refills | Status: DC | PRN

## 2017-12-17 MED ORDER — ESCITALOPRAM OXALATE 20 MG PO TABS: 20.0000 mg | ORAL_TABLET | Freq: Every day | ORAL | 3 refills | Status: DC

## 2017-12-17 MED ORDER — LAMOTRIGINE 100 MG PO TABS: 100.0000 mg | ORAL_TABLET | Freq: Two times a day (BID) | ORAL | 3 refills | Status: DC

## 2017-12-17 MED ORDER — TOPIRAMATE 25 MG PO TABS: ORAL_TABLET | ORAL | 3 refills | Status: DC

## 2017-12-17 NOTE — Progress Notes (Signed)
[facility]  PATIENT: Amy Hopkins DOB: May 16, 1965  REFERRING DOCTOR OR PCP:  Finis Bud SOURCE: Patient, notes from Dr. Dory Larsen and Dr. Trula Ore, MRI reports, lab reports, MRI images on CD  _________________________________   HISTORICAL  CHIEF COMPLAINT:  Chief Complaint  Patient presents with  . New Patient (Initial Visit)    RM 13, alone. Last seen 04/08/17. Taking lexapro 20mg  daily. Since EEG here, she has had 3 episodes. Most recent one yesterday. This lasted 10 minutes. Had tremors.     HISTORY OF PRESENT ILLNESS:  Amy Hopkins is a 52 y.o. woman with spells of transient alteration of awareness and shaking    Update 12/17/2017: She reports having a couple more spells.   She had a very short episode with some shaking but no LOC while on the coach.   She had two spells yesterday.    They each lasted about 10 minutes and were similar to spells in the past.     She was conscious during the spells.   Of note, she was having a lot more LBP yesterday while she was sitting at work.   She got up to move around and the leg started to shake.   She improved but two hours later, while going to her car, she had a second episode  She saw psychiatry for one visit.   No changes in medication were made.   A main stressor is her mom had a major stroke and is locked in .     She is still on Lexapro and lamotrigine and tolerates them well.    She is also on topiramate 25 mg nightly for migraines.    This is helping her migraines a lot. She   Update 04/08/2017: Over the past year, she has had about a dozen spells with transient alteration of awareness and shaking, right greater than left.   Additionally, in 2011, she had a generalized tonic-clonic seizure while she was on Wellbutrin and tramadol. In the intervening 6-7 years  she had no episodes.   I reviewed the findings of the EEG. It showed a nonepileptic seizure (pseudoseizure).   I evaluated her during the  episode and she had alternating arm shaking at times and just shook the right side of her body at other times. There was distractibility of the motor symptoms. She had preserved consciousness. She was able to talk and follow simple commands.   This spell was similar to the spells that she has been experiencing over the past few months.  She denies any significant stressor currently in life. She also denies a history of physical or sexual abuse. She does note a history of depression and anxiety many years ago when her mother had a stroke and she saw a psychiatrist for short period time she has been on Lexapro 10 mg for many years. She also has migraine headaches and she reports that they are doing well.   Of note, she is on lamotrigine 100 mg by mouth twice a day and Topamax 25 mg daily at bedtime for migraine headaches. Her migraines are doing very well.  Update 03/28/2017: She is having spells of altered consciousness lasting 20 minutes.     She gets right sided shaking.  No LOC and is able to talk though she does not feel completely baseline with thought.     Her head will shake on the more severe episodes.   They appear to e random without any trigger.  She has had 8 spells since last October.   A few occurred the couple weeks after Christmas and she had more stress but others occurred with typical stress.   Spells come on suddenly but gradually gets better.    She is tired afterward and has a mild foot drop on the right for the rest of the day.     Spells always occur on her right.   Sitting will ease th spell some.   Her first spell was one year ago.   She is on lamotrigine and topiramate (migraines).      In the past, MRI showed some non-specific foci but CSF was normal.      Migraines are doing well.      From 11/07/2016:  She went to the ED 09/27/16 after presenting with right leg shaking, much worse when standing.   She also felt weaker on her right leg and was dragging it some.    I looked at a  video that she took. She was standing at the time. It did not look like seizure activity. She went to the ED and had an MRI performed.   I personally looked at the images and compared to the MRI from December 2017. The current MRI shows C3C4 (mildly worse than previous MRI), C5C6 and C6C7 spinal stenosis.   There is mild anterolisthesis at C7T1.    The spinal cord appears normal with no evidence of MS or myelopathy.  Numbness/weakness:   Her right leg gets numbness up to the thigh.   The numbness comes and goes.   She feels strength is reduced intermittently, noting more weakness when she also has numbness.     She has mild urinary frequency but no nocturia.     Bending her neck does not exacerbate symptoms.      Migraines:   Her migraines are much better on Topamax 25 mg nightly    Abnormal MRI/Possible MS:  Because of a spell of transient alteration of awareness in August 2017, she had an MRI.   The MRI shows nonspecific white matter changes slightly progressed her to the 2011 MRI.  He was felt by one neurologist to have MS scribe Copaxone but never took it. She was then referred to me for second opinion. I personally reviewed the MRIs.    I believe that the changes are most likely to represent chronic microvascular ischemic change or changes from the migraine headaches. I think demyelination from MS is significantly less likely so I did not think that she should start a disease modifying therapy.Also,   a lumbar puncture was reportedly negative.  There is no family history of MS.   Transient alteration of awareness.   She had a seizure in 2011 and also had an episode of altered awareness in August 2017 the first episode in 2011 did have generalized tonic-clonic activity and occurred while on Wellbutrin and tramadol. . The second spell did not have any precipitating factor though it is uncertain whether it represented a seizure as there was no loss of consciousness.     She went to [facility] emergency room  and was referred to Dr. Trula Ore.  The EEG was read as abnormal (right parietal and temporal slowing) and she was started on lamotrigine 100 mg by mouth twice a day. She tolerates it well. We discussed that it is uncertain about the significance of the EEG findings as a little slowing is not necessarily abnormal though it can be seen in people  who have had seizures. Since lamotrigine also could help some other symptoms she will continue on it for the time being.  Migraine:   She has 3 -4 migraines a month, usually improved with  She has a long history of migraine headaches. For the most part, these are common migraine headaches without aura. She will get pounding bilateral pain, photophobia, phonophobia and nausea and sometimes vomiting. Her head will make the pain worse. Imitrex usually helps the pain.   These are actually occurring less since starting lamotrigine.  CTS:   She had a nerve conduction and EMG study performed. It showed that she had the nerve conduction study showed moderate right and mild left carpal tunnel syndrome and no-shows showed mild cervical and lumbosacral radiculopathies.    Anxiety:  She has some anxiety and is on Lexapro.  She tolerates it well.   REVIEW OF SYSTEMS: Constitutional: No fevers, chills, sweats, or change in appetite Eyes: No visual changes, double vision, eye pain Ear, nose and throat: No hearing loss, ear pain, nasal congestion, sore throat Cardiovascular: No chest pain, palpitations Respiratory: No shortness of breath at rest or with exertion.   No wheezes GastrointestinaI: No nausea, vomiting, diarrhea, abdominal pain, fecal incontinence Genitourinary: No dysuria, urinary retention or frequency.  No nocturia. Musculoskeletal: No neck pain, back pain Integumentary: No rash, pruritus, skin lesions Neurological: as above Psychiatric: No depression at this time. She has anxiety Endocrine: No palpitations, diaphoresis, change in appetite, change in weigh  or increased thirst Hematologic/Lymphatic: No anemia, purpura, petechiae. Allergic/Immunologic: No itchy/runny eyes, nasal congestion, recent allergic reactions, rashes  ALLERGIES: Allergies  Allergen Reactions  . Tramadol Other (See Comments)    Seizures     HOME MEDICATIONS:  Current Outpatient Medications:  .  acetaminophen (TYLENOL) 500 MG tablet, Take 1,000 mg by mouth every 6 (six) hours as needed for mild pain. , Disp: , Rfl:  .  aspirin 81 MG chewable tablet, Chew 81 mg by mouth daily., Disp: , Rfl:  .  escitalopram (LEXAPRO) 20 MG tablet, Take 1 tablet (20 mg total) by mouth daily., Disp: 90 tablet, Rfl: 3 .  ibuprofen (ADVIL,MOTRIN) 200 MG tablet, Take 200 mg by mouth every 6 (six) hours as needed for mild pain. , Disp: , Rfl:  .  lamoTRIgine (LAMICTAL) 100 MG tablet, Take 1 tablet (100 mg total) by mouth 2 (two) times daily., Disp: 180 tablet, Rfl: 3 .  topiramate (TOPAMAX) 25 MG tablet, TAKE 1 TABLET BY MOUTH EVERYDAY AT BEDTIME, Disp: 90 tablet, Rfl: 3 .  SUMAtriptan (IMITREX) 100 MG tablet, Take 1 tablet (100 mg total) by mouth once as needed for up to 1 dose for migraine. May repeat in 2 hours if headache persists or recurs., Disp: 10 tablet, Rfl: 2  Current Facility-Administered Medications:  .  0.9 %  sodium chloride infusion, 500 mL, Intravenous, Continuous, Armbruster, Carlota Raspberry, MD  PAST MEDICAL HISTORY: Past Medical History:  Diagnosis Date  . Allergy   . Anxiety   . Cancer ([place])   . Headache   . Melanoma ([place])   . Multiple sclerosis ([place])    not sure if so at this point- ? misdiagnosed   . Osteopenia   . Psoriasis   . Seizures ([place])    last seizure 2011 per pt in [facility] 11-27-2016  . Vision abnormalities     PAST SURGICAL HISTORY: Past Surgical History:  Procedure Laterality Date  . BREAST BIOPSY  1993   benign  . MELANOMA EXCISION    .  MYOMECTOMY  2012  . wisdom teeth      FAMILY HISTORY: Family History  Problem Relation Age of Onset  . Stroke  Mother   . Breast cancer Mother   . Hypertension Father   . High Cholesterol Father   . Psoriasis Father   . Healthy Brother   . Transient ischemic attack Maternal Grandmother   . Esophageal cancer Maternal Grandfather   . Colon cancer Neg Hx   . Colon polyps Neg Hx   . Rectal cancer Neg Hx   . Stomach cancer Neg Hx     SOCIAL HISTORY:  Social History   Socioeconomic History  . Marital status: Married    Spouse name: Dominica Severin  . Number of children: Not on file  . Years of education: Not on file  . Highest education level: Not on file  Occupational History  . Not on file  Social Needs  . Financial resource strain: Not on file  . Food insecurity:    Worry: Not on file    Inability: Not on file  . Transportation needs:    Medical: Not on file    Non-medical: Not on file  Tobacco Use  . Smoking status: Former Smoker    Last attempt to quit: 1996    Years since quitting: 23.8  . Smokeless tobacco: Never Used  Substance and Sexual Activity  . Alcohol use: Yes    Comment: occasional  . Drug use: No  . Sexual activity: Yes  Lifestyle  . Physical activity:    Days per week: Not on file    Minutes per session: Not on file  . Stress: Not on file  Relationships  . Social connections:    Talks on phone: Not on file    Gets together: Not on file    Attends religious service: Not on file    Active member of club or organization: Not on file    Attends meetings of clubs or organizations: Not on file    Relationship status: Not on file  . Intimate partner violence:    Fear of current or ex partner: Not on file    Emotionally abused: Not on file    Physically abused: Not on file    Forced sexual activity: Not on file  Other Topics Concern  . Not on file  Social History Narrative  . Not on file     PHYSICAL EXAM  Vitals:   12/17/17 1016  BP: 117/80  Pulse: 68  Weight: 131 lb (59.4 kg)  Height: 5' (1.524 m)    Body mass index is 25.58 kg/m.   General: The  patient is well-developed and well-nourished and in no acute distress   Neurologic Exam:  Mental status: She is alert and fully oriented.. The patient has apparent normal recent and remote memory, with an apparently normal attention span and concentration ability.   Speech is normal.  Cranial nerves: Extraocular movements are full.  Facial strength was normal.  The tongue is midline, and the patient has symmetric elevation of the soft palate. No obvious hearing deficits are noted.  Motor:  Muscle bulk is normal.   Tone is normal. Strength appeared to be normal and symmetric.    Gait and station: Station is normal.   The gait and the tandem gait were normal.  Romberg is negative...   Reflexes: Deep tendon reflexes are symmetric in the arms.         DIAGNOSTIC DATA (LABS, IMAGING, TESTING) - I reviewed  patient records, labs, notes, testing and imaging myself where available.      ASSESSMENT AND PLAN  Psychogenic nonepileptic seizure  Shaking  Depression, unspecified depression type  Migraine without status migrainosus, not intractable, unspecified migraine type   1.  Her recent spells also appeared to be nonepileptic.   2.   Continue Lexapro, lamotrigine for depression and mood stabilization. 3.   Continue topiramate at night for migraines with sumatriptan as needed 4.    Return to clinic in 12 months or sooner if there are new or worsening  neurologic symptoms    Kip Cropp A. Felecia Shelling, MD, PhD 22/44/9753, 00:51 AM Certified in Neurology, Clinical Neurophysiology, Sleep Medicine, Pain Medicine and Neuroimaging  [facility] [address], [place], [place] [address] [phone]

## 2017-12-19 DIAGNOSIS — M9903 Segmental and somatic dysfunction of lumbar region: Secondary | ICD-10-CM | POA: Diagnosis not present

## 2017-12-19 DIAGNOSIS — M9901 Segmental and somatic dysfunction of cervical region: Secondary | ICD-10-CM | POA: Diagnosis not present

## 2017-12-19 DIAGNOSIS — M531 Cervicobrachial syndrome: Secondary | ICD-10-CM | POA: Diagnosis not present

## 2017-12-19 DIAGNOSIS — M5441 Lumbago with sciatica, right side: Secondary | ICD-10-CM | POA: Diagnosis not present

## 2018-01-02 DIAGNOSIS — M5441 Lumbago with sciatica, right side: Secondary | ICD-10-CM | POA: Diagnosis not present

## 2018-01-02 DIAGNOSIS — M9901 Segmental and somatic dysfunction of cervical region: Secondary | ICD-10-CM | POA: Diagnosis not present

## 2018-01-02 DIAGNOSIS — M531 Cervicobrachial syndrome: Secondary | ICD-10-CM | POA: Diagnosis not present

## 2018-01-02 DIAGNOSIS — M9903 Segmental and somatic dysfunction of lumbar region: Secondary | ICD-10-CM | POA: Diagnosis not present

## 2018-01-09 DIAGNOSIS — M9901 Segmental and somatic dysfunction of cervical region: Secondary | ICD-10-CM | POA: Diagnosis not present

## 2018-01-09 DIAGNOSIS — M5441 Lumbago with sciatica, right side: Secondary | ICD-10-CM | POA: Diagnosis not present

## 2018-01-09 DIAGNOSIS — M9903 Segmental and somatic dysfunction of lumbar region: Secondary | ICD-10-CM | POA: Diagnosis not present

## 2018-01-09 DIAGNOSIS — M531 Cervicobrachial syndrome: Secondary | ICD-10-CM | POA: Diagnosis not present

## 2018-01-14 DIAGNOSIS — M531 Cervicobrachial syndrome: Secondary | ICD-10-CM | POA: Diagnosis not present

## 2018-01-14 DIAGNOSIS — M5441 Lumbago with sciatica, right side: Secondary | ICD-10-CM | POA: Diagnosis not present

## 2018-01-14 DIAGNOSIS — M9903 Segmental and somatic dysfunction of lumbar region: Secondary | ICD-10-CM | POA: Diagnosis not present

## 2018-01-14 DIAGNOSIS — M9901 Segmental and somatic dysfunction of cervical region: Secondary | ICD-10-CM | POA: Diagnosis not present

## 2018-01-28 DIAGNOSIS — J9801 Acute bronchospasm: Secondary | ICD-10-CM | POA: Diagnosis not present

## 2018-01-28 DIAGNOSIS — J069 Acute upper respiratory infection, unspecified: Secondary | ICD-10-CM | POA: Diagnosis not present

## 2018-02-04 DIAGNOSIS — M9904 Segmental and somatic dysfunction of sacral region: Secondary | ICD-10-CM | POA: Diagnosis not present

## 2018-02-04 DIAGNOSIS — M9903 Segmental and somatic dysfunction of lumbar region: Secondary | ICD-10-CM | POA: Diagnosis not present

## 2018-02-04 DIAGNOSIS — M9901 Segmental and somatic dysfunction of cervical region: Secondary | ICD-10-CM | POA: Diagnosis not present

## 2018-02-04 DIAGNOSIS — M5441 Lumbago with sciatica, right side: Secondary | ICD-10-CM | POA: Diagnosis not present

## 2018-02-06 DIAGNOSIS — M5441 Lumbago with sciatica, right side: Secondary | ICD-10-CM | POA: Diagnosis not present

## 2018-02-06 DIAGNOSIS — M9904 Segmental and somatic dysfunction of sacral region: Secondary | ICD-10-CM | POA: Diagnosis not present

## 2018-02-06 DIAGNOSIS — M9903 Segmental and somatic dysfunction of lumbar region: Secondary | ICD-10-CM | POA: Diagnosis not present

## 2018-02-06 DIAGNOSIS — M9901 Segmental and somatic dysfunction of cervical region: Secondary | ICD-10-CM | POA: Diagnosis not present

## 2018-03-13 DIAGNOSIS — M5441 Lumbago with sciatica, right side: Secondary | ICD-10-CM | POA: Diagnosis not present

## 2018-03-13 DIAGNOSIS — M9903 Segmental and somatic dysfunction of lumbar region: Secondary | ICD-10-CM | POA: Diagnosis not present

## 2018-03-13 DIAGNOSIS — M9901 Segmental and somatic dysfunction of cervical region: Secondary | ICD-10-CM | POA: Diagnosis not present

## 2018-03-13 DIAGNOSIS — M9904 Segmental and somatic dysfunction of sacral region: Secondary | ICD-10-CM | POA: Diagnosis not present

## 2018-04-01 DIAGNOSIS — M9904 Segmental and somatic dysfunction of sacral region: Secondary | ICD-10-CM | POA: Diagnosis not present

## 2018-04-01 DIAGNOSIS — M9903 Segmental and somatic dysfunction of lumbar region: Secondary | ICD-10-CM | POA: Diagnosis not present

## 2018-04-01 DIAGNOSIS — M9901 Segmental and somatic dysfunction of cervical region: Secondary | ICD-10-CM | POA: Diagnosis not present

## 2018-04-01 DIAGNOSIS — M5441 Lumbago with sciatica, right side: Secondary | ICD-10-CM | POA: Diagnosis not present

## 2018-04-15 DIAGNOSIS — L700 Acne vulgaris: Secondary | ICD-10-CM | POA: Diagnosis not present

## 2018-04-15 DIAGNOSIS — L720 Epidermal cyst: Secondary | ICD-10-CM | POA: Diagnosis not present

## 2018-04-15 DIAGNOSIS — L400 Psoriasis vulgaris: Secondary | ICD-10-CM | POA: Diagnosis not present

## 2018-04-15 DIAGNOSIS — C44719 Basal cell carcinoma of skin of left lower limb, including hip: Secondary | ICD-10-CM | POA: Diagnosis not present

## 2018-04-15 DIAGNOSIS — L814 Other melanin hyperpigmentation: Secondary | ICD-10-CM | POA: Diagnosis not present

## 2018-04-15 DIAGNOSIS — D485 Neoplasm of uncertain behavior of skin: Secondary | ICD-10-CM | POA: Diagnosis not present

## 2018-04-15 DIAGNOSIS — L570 Actinic keratosis: Secondary | ICD-10-CM | POA: Diagnosis not present

## 2018-04-23 DIAGNOSIS — M5441 Lumbago with sciatica, right side: Secondary | ICD-10-CM | POA: Diagnosis not present

## 2018-04-23 DIAGNOSIS — M9903 Segmental and somatic dysfunction of lumbar region: Secondary | ICD-10-CM | POA: Diagnosis not present

## 2018-04-23 DIAGNOSIS — M9904 Segmental and somatic dysfunction of sacral region: Secondary | ICD-10-CM | POA: Diagnosis not present

## 2018-04-23 DIAGNOSIS — M9901 Segmental and somatic dysfunction of cervical region: Secondary | ICD-10-CM | POA: Diagnosis not present

## 2018-04-25 DIAGNOSIS — L309 Dermatitis, unspecified: Secondary | ICD-10-CM | POA: Diagnosis not present

## 2018-04-25 DIAGNOSIS — Z1231 Encounter for screening mammogram for malignant neoplasm of breast: Secondary | ICD-10-CM | POA: Diagnosis not present

## 2018-04-25 DIAGNOSIS — Z124 Encounter for screening for malignant neoplasm of cervix: Secondary | ICD-10-CM | POA: Diagnosis not present

## 2018-04-25 DIAGNOSIS — F4323 Adjustment disorder with mixed anxiety and depressed mood: Secondary | ICD-10-CM | POA: Diagnosis not present

## 2018-04-25 DIAGNOSIS — L409 Psoriasis, unspecified: Secondary | ICD-10-CM | POA: Diagnosis not present

## 2018-04-25 DIAGNOSIS — Z0000 Encounter for general adult medical examination without abnormal findings: Secondary | ICD-10-CM | POA: Diagnosis not present

## 2018-04-25 DIAGNOSIS — E559 Vitamin D deficiency, unspecified: Secondary | ICD-10-CM | POA: Diagnosis not present

## 2018-04-25 DIAGNOSIS — Z01419 Encounter for gynecological examination (general) (routine) without abnormal findings: Secondary | ICD-10-CM | POA: Diagnosis not present

## 2018-07-11 DIAGNOSIS — Z1231 Encounter for screening mammogram for malignant neoplasm of breast: Secondary | ICD-10-CM | POA: Diagnosis not present

## 2018-07-24 DIAGNOSIS — R922 Inconclusive mammogram: Secondary | ICD-10-CM | POA: Diagnosis not present

## 2018-07-24 DIAGNOSIS — N6321 Unspecified lump in the left breast, upper outer quadrant: Secondary | ICD-10-CM | POA: Diagnosis not present

## 2018-07-24 DIAGNOSIS — R928 Other abnormal and inconclusive findings on diagnostic imaging of breast: Secondary | ICD-10-CM | POA: Diagnosis not present

## 2018-08-21 DIAGNOSIS — C44719 Basal cell carcinoma of skin of left lower limb, including hip: Secondary | ICD-10-CM | POA: Diagnosis not present

## 2018-08-21 DIAGNOSIS — L905 Scar conditions and fibrosis of skin: Secondary | ICD-10-CM | POA: Diagnosis not present

## 2018-09-09 DIAGNOSIS — M25512 Pain in left shoulder: Secondary | ICD-10-CM | POA: Diagnosis not present

## 2018-09-09 DIAGNOSIS — M7502 Adhesive capsulitis of left shoulder: Secondary | ICD-10-CM | POA: Diagnosis not present

## 2018-09-23 DIAGNOSIS — M7502 Adhesive capsulitis of left shoulder: Secondary | ICD-10-CM | POA: Diagnosis not present

## 2018-09-23 DIAGNOSIS — R29898 Other symptoms and signs involving the musculoskeletal system: Secondary | ICD-10-CM | POA: Diagnosis not present

## 2018-09-23 DIAGNOSIS — M25512 Pain in left shoulder: Secondary | ICD-10-CM | POA: Diagnosis not present

## 2018-10-15 DIAGNOSIS — R29898 Other symptoms and signs involving the musculoskeletal system: Secondary | ICD-10-CM | POA: Diagnosis not present

## 2018-10-15 DIAGNOSIS — M7502 Adhesive capsulitis of left shoulder: Secondary | ICD-10-CM | POA: Diagnosis not present

## 2018-10-15 DIAGNOSIS — M25512 Pain in left shoulder: Secondary | ICD-10-CM | POA: Diagnosis not present

## 2018-10-22 DIAGNOSIS — M25512 Pain in left shoulder: Secondary | ICD-10-CM | POA: Diagnosis not present

## 2018-10-22 DIAGNOSIS — M7502 Adhesive capsulitis of left shoulder: Secondary | ICD-10-CM | POA: Diagnosis not present

## 2018-10-29 DIAGNOSIS — M7502 Adhesive capsulitis of left shoulder: Secondary | ICD-10-CM | POA: Diagnosis not present

## 2018-10-29 DIAGNOSIS — M25512 Pain in left shoulder: Secondary | ICD-10-CM | POA: Diagnosis not present

## 2018-10-31 DIAGNOSIS — L309 Dermatitis, unspecified: Secondary | ICD-10-CM | POA: Diagnosis not present

## 2018-10-31 DIAGNOSIS — G43909 Migraine, unspecified, not intractable, without status migrainosus: Secondary | ICD-10-CM | POA: Diagnosis not present

## 2018-10-31 DIAGNOSIS — F4323 Adjustment disorder with mixed anxiety and depressed mood: Secondary | ICD-10-CM | POA: Diagnosis not present

## 2018-10-31 DIAGNOSIS — E7800 Pure hypercholesterolemia, unspecified: Secondary | ICD-10-CM | POA: Diagnosis not present

## 2018-10-31 DIAGNOSIS — Z23 Encounter for immunization: Secondary | ICD-10-CM | POA: Diagnosis not present

## 2018-11-03 DIAGNOSIS — M5441 Lumbago with sciatica, right side: Secondary | ICD-10-CM | POA: Diagnosis not present

## 2018-11-03 DIAGNOSIS — M9904 Segmental and somatic dysfunction of sacral region: Secondary | ICD-10-CM | POA: Diagnosis not present

## 2018-11-03 DIAGNOSIS — M9901 Segmental and somatic dysfunction of cervical region: Secondary | ICD-10-CM | POA: Diagnosis not present

## 2018-11-03 DIAGNOSIS — M9903 Segmental and somatic dysfunction of lumbar region: Secondary | ICD-10-CM | POA: Diagnosis not present

## 2018-11-04 DIAGNOSIS — M25512 Pain in left shoulder: Secondary | ICD-10-CM | POA: Diagnosis not present

## 2018-11-04 DIAGNOSIS — M7502 Adhesive capsulitis of left shoulder: Secondary | ICD-10-CM | POA: Diagnosis not present

## 2018-11-12 DIAGNOSIS — M7502 Adhesive capsulitis of left shoulder: Secondary | ICD-10-CM | POA: Diagnosis not present

## 2018-11-12 DIAGNOSIS — M25512 Pain in left shoulder: Secondary | ICD-10-CM | POA: Diagnosis not present

## 2018-11-17 DIAGNOSIS — M9903 Segmental and somatic dysfunction of lumbar region: Secondary | ICD-10-CM | POA: Diagnosis not present

## 2018-11-17 DIAGNOSIS — M9904 Segmental and somatic dysfunction of sacral region: Secondary | ICD-10-CM | POA: Diagnosis not present

## 2018-11-17 DIAGNOSIS — M9901 Segmental and somatic dysfunction of cervical region: Secondary | ICD-10-CM | POA: Diagnosis not present

## 2018-11-17 DIAGNOSIS — M5441 Lumbago with sciatica, right side: Secondary | ICD-10-CM | POA: Diagnosis not present

## 2018-11-24 ENCOUNTER — Telehealth: Payer: Self-pay | Admitting: Neurology

## 2018-11-24 NOTE — Telephone Encounter (Signed)
I called patient regarding r/s 10/30 appt due to office being closed each Friday. Patient states she is in a meeting and will call back to reschedule.

## 2018-12-10 DIAGNOSIS — M5441 Lumbago with sciatica, right side: Secondary | ICD-10-CM | POA: Diagnosis not present

## 2018-12-10 DIAGNOSIS — M9903 Segmental and somatic dysfunction of lumbar region: Secondary | ICD-10-CM | POA: Diagnosis not present

## 2018-12-10 DIAGNOSIS — M9901 Segmental and somatic dysfunction of cervical region: Secondary | ICD-10-CM | POA: Diagnosis not present

## 2018-12-10 DIAGNOSIS — M9904 Segmental and somatic dysfunction of sacral region: Secondary | ICD-10-CM | POA: Diagnosis not present

## 2018-12-19 ENCOUNTER — Ambulatory Visit: Payer: BLUE CROSS/BLUE SHIELD | Admitting: Neurology

## 2018-12-21 ENCOUNTER — Other Ambulatory Visit: Payer: Self-pay | Admitting: Neurology

## 2019-01-08 ENCOUNTER — Other Ambulatory Visit: Payer: Self-pay | Admitting: Neurology

## 2019-01-09 DIAGNOSIS — H10021 Other mucopurulent conjunctivitis, right eye: Secondary | ICD-10-CM | POA: Diagnosis not present

## 2019-01-14 DIAGNOSIS — H10021 Other mucopurulent conjunctivitis, right eye: Secondary | ICD-10-CM | POA: Diagnosis not present

## 2019-01-22 ENCOUNTER — Other Ambulatory Visit: Payer: Self-pay | Admitting: Neurology

## 2019-01-30 DIAGNOSIS — M85852 Other specified disorders of bone density and structure, left thigh: Secondary | ICD-10-CM | POA: Diagnosis not present

## 2019-01-30 DIAGNOSIS — Z780 Asymptomatic menopausal state: Secondary | ICD-10-CM | POA: Diagnosis not present

## 2019-01-30 DIAGNOSIS — M8588 Other specified disorders of bone density and structure, other site: Secondary | ICD-10-CM | POA: Diagnosis not present

## 2019-01-30 DIAGNOSIS — Z13820 Encounter for screening for osteoporosis: Secondary | ICD-10-CM | POA: Diagnosis not present

## 2019-02-05 ENCOUNTER — Other Ambulatory Visit: Payer: Self-pay | Admitting: Neurology

## 2019-02-11 ENCOUNTER — Other Ambulatory Visit: Payer: Self-pay | Admitting: *Deleted

## 2019-02-11 MED ORDER — TOPIRAMATE 25 MG PO TABS: ORAL_TABLET | ORAL | 0 refills | Status: DC

## 2019-02-17 IMAGING — DX DG CHEST 2V
2 series · 2 of 2 positions shown · non-contrast
Comparison: None.

CLINICAL DATA: Chest pain

EXAM:
CHEST  2 VIEW

[w chest pa]
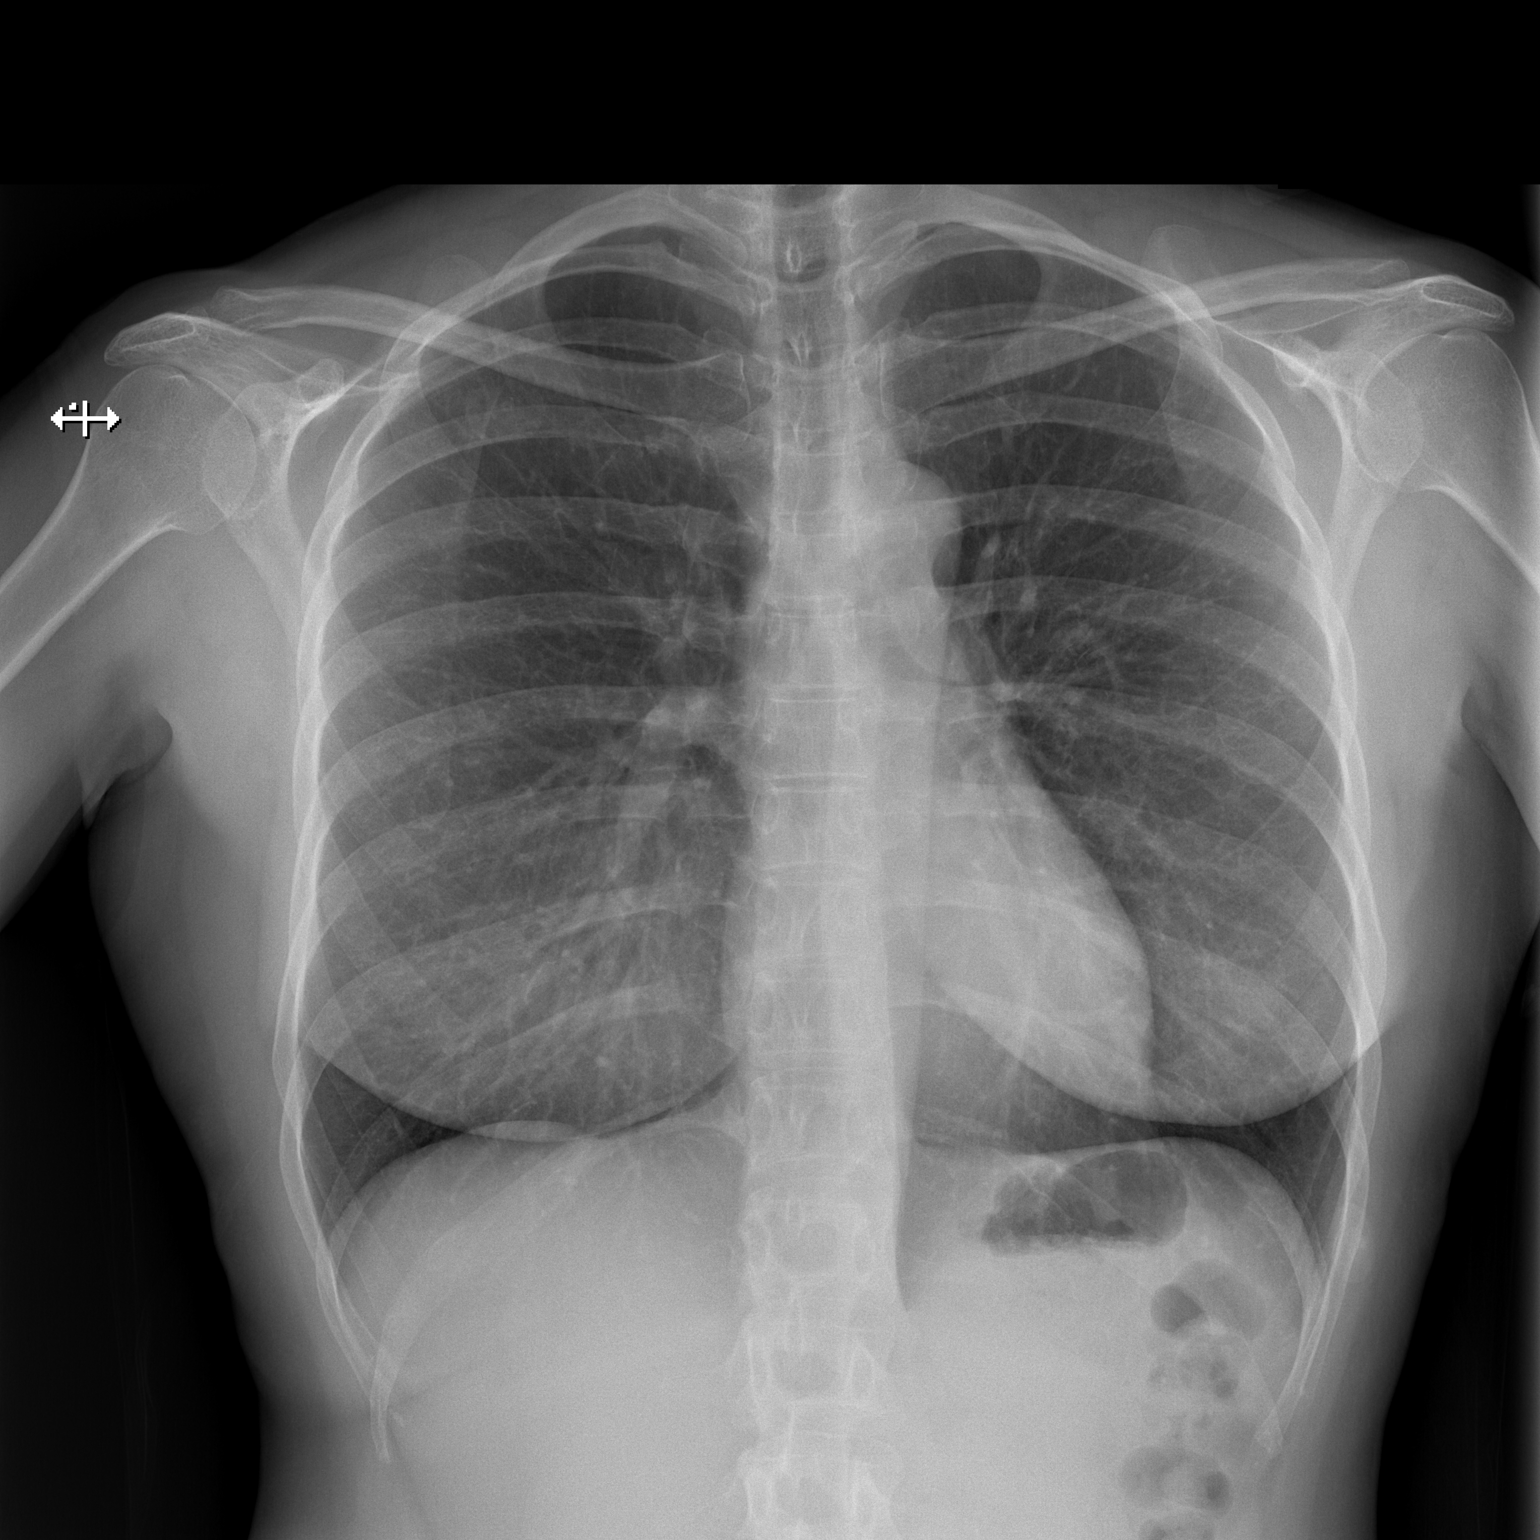

[w chest lat]
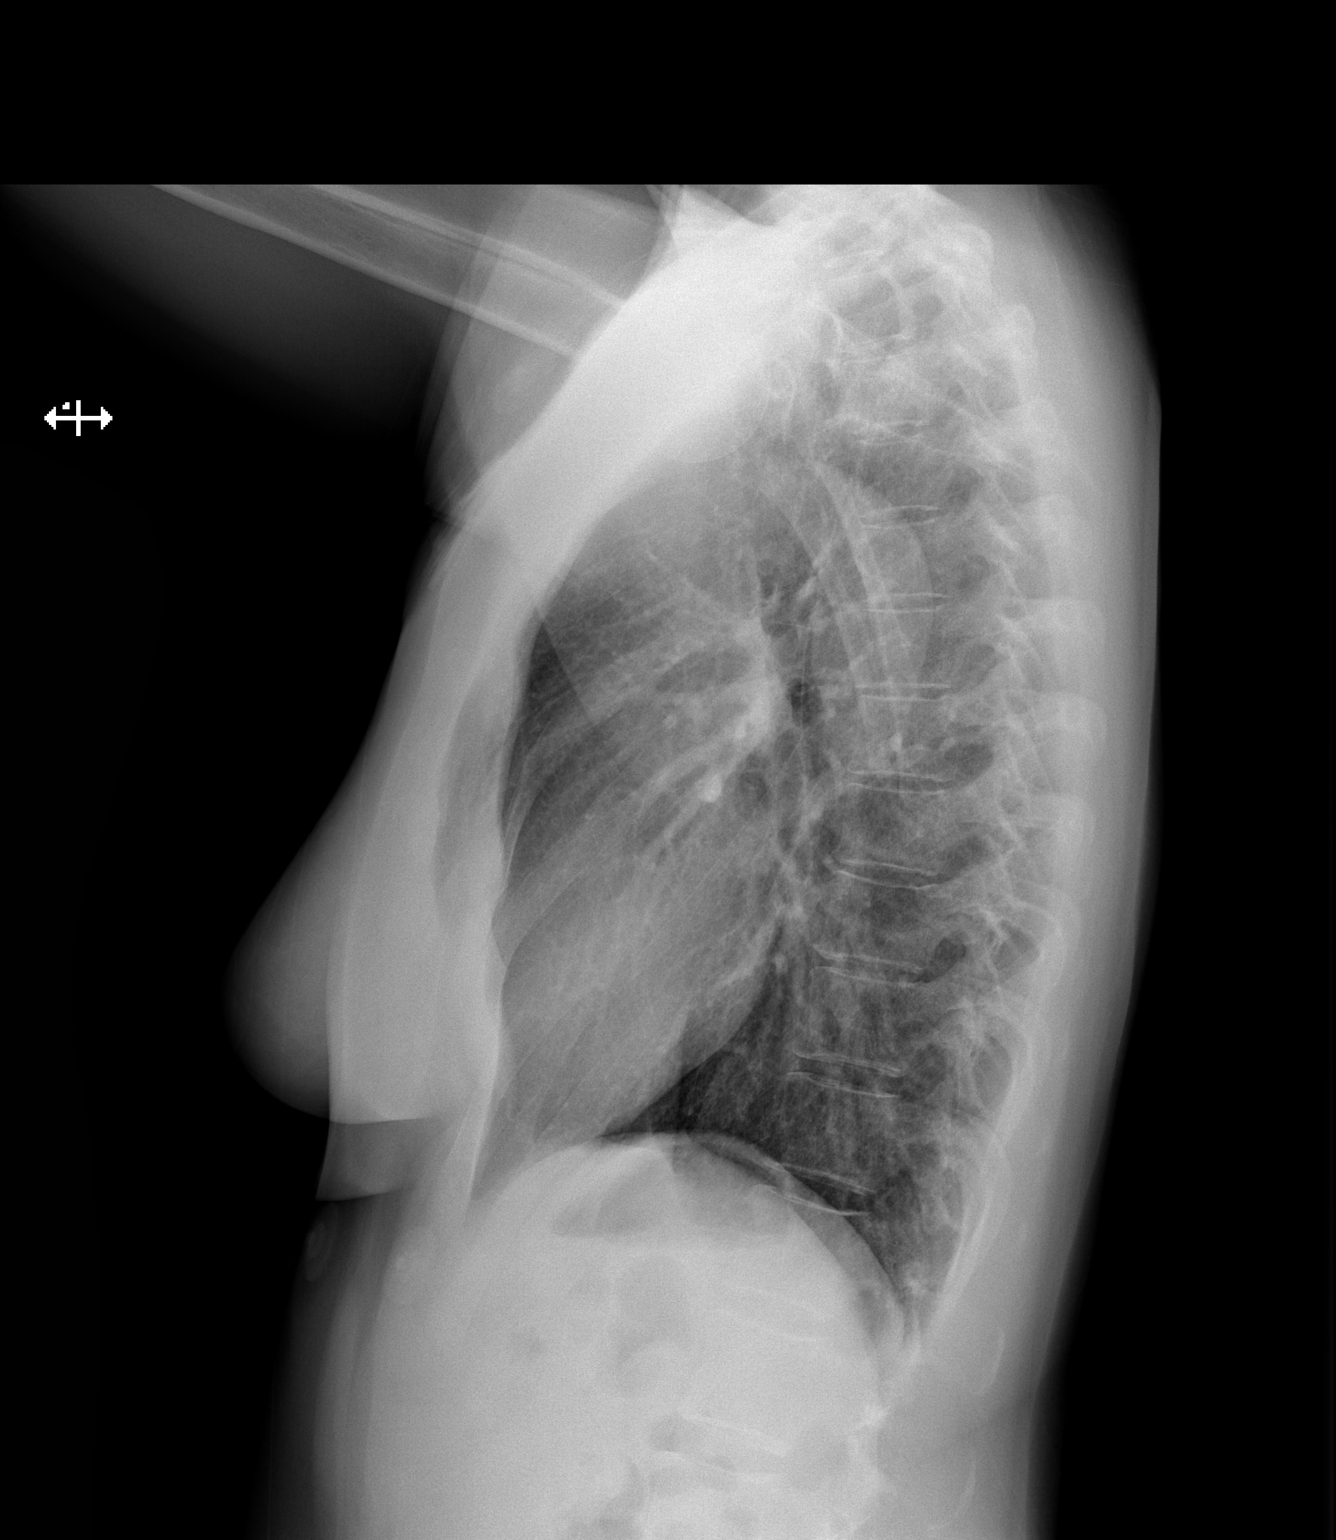

[2 of 2 positions shown; findings below may reference images not displayed]

FINDINGS: Normal heart size. Normal mediastinal contour. No pneumothorax. No
pleural effusion. Lungs appear clear, with no acute consolidative
airspace disease and no pulmonary edema.
IMPRESSION: No active cardiopulmonary disease.

## 2019-02-17 IMAGING — MR MR THORACIC SPINE W/O CM
4 of 11 series · 19 of 48 positions shown · non-contrast
Comparison: Cervical spine MRI 01/27/2016

CLINICAL DATA: Neck pain and bilateral leg weakness

EXAM:
MRI CERVICAL SPINE WITHOUT CONTRAST
MRI THORACIC SPINE WITHOUT CONTRAST
TECHNIQUE: Multiplanar and multiecho pulse sequences of the cervical and
thoracic spine were obtained without intravenous contrast.

[Series 6: T2 · sagittal · 3.0mm · 0.41mm/px · 3 of 12 slices shown (1 of 4)]
[im 1/12]
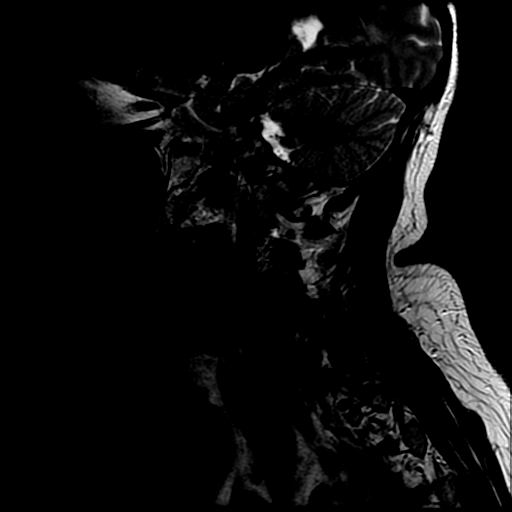
[im 6/12]
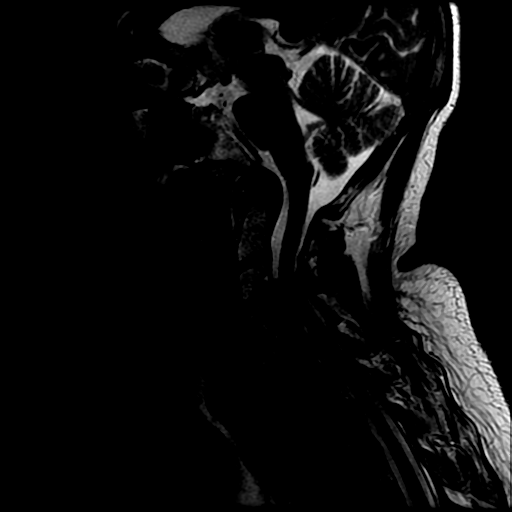
[im 12/12]
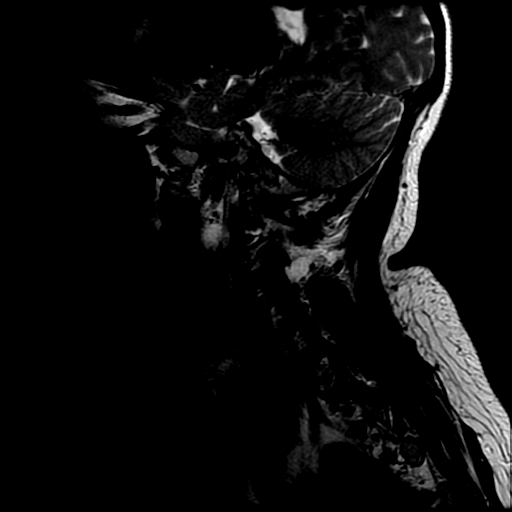

[Series 8: T2 · axial · 3.0mm · 0.39mm/px · z∈[-67,+14]mm · 6 of 26 slices shown (2 of 4)]
[im 1/26]
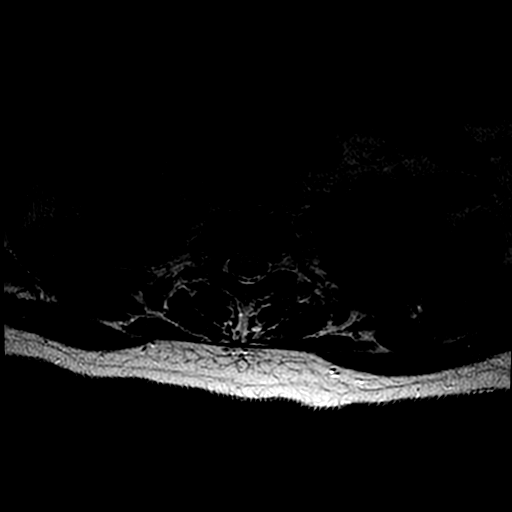
[im 6/26]
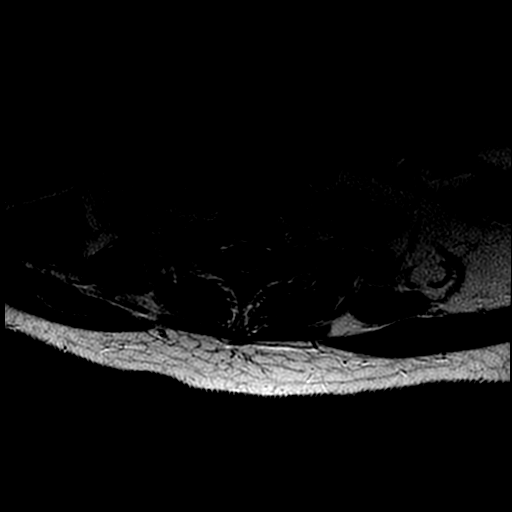
[im 11/26]
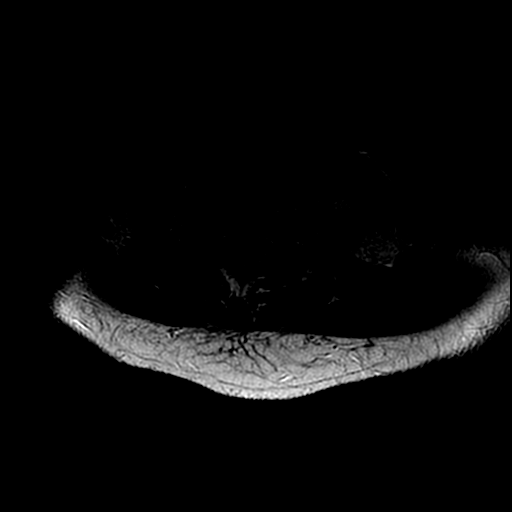
[im 16/26]
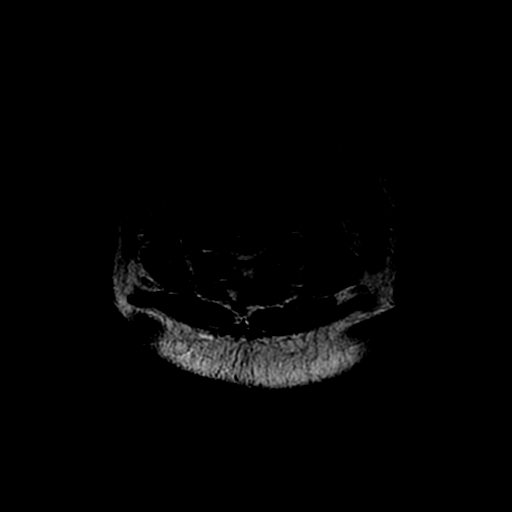
[im 21/26]
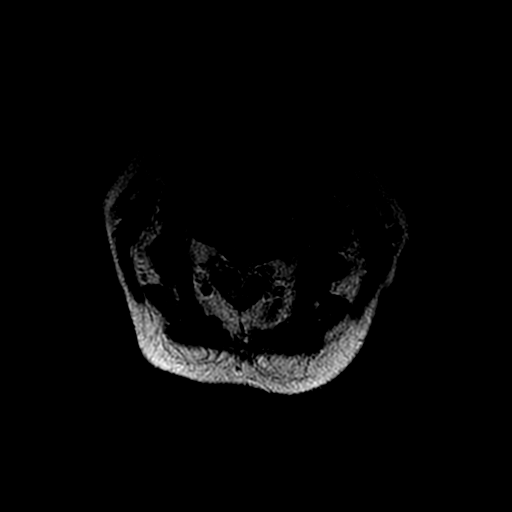
[im 26/26]
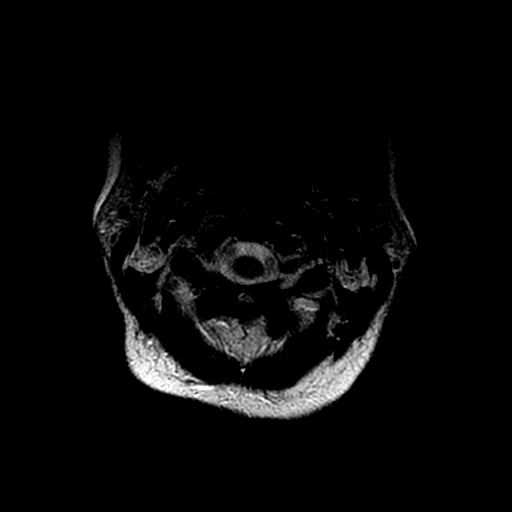

[Series 17: T2 · sagittal · 3.0mm · 0.64mm/px · 3 of 14 slices shown (3 of 4)]
[im 1/14]
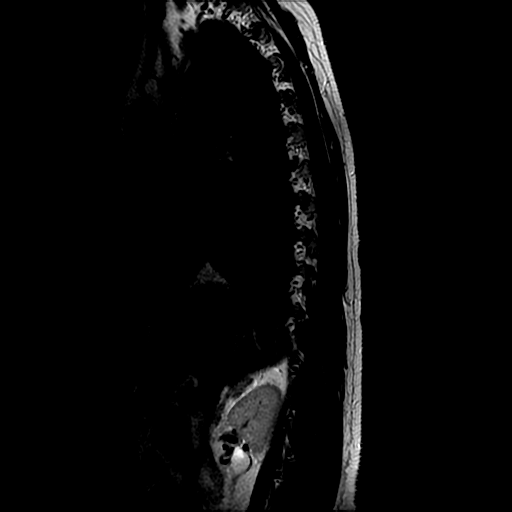
[im 7/14]
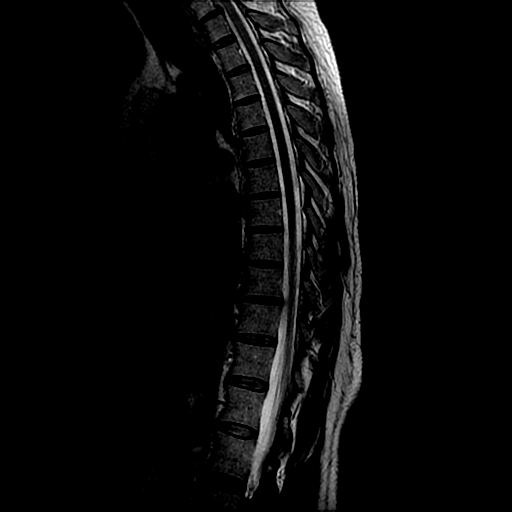
[im 14/14]
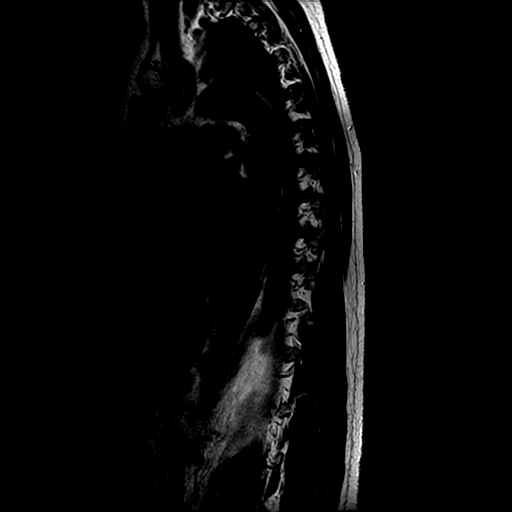

[Series 20: T2 · axial · 4.0mm · 0.43mm/px · z∈[-285,-104]mm · 7 of 36 slices shown (4 of 4)]
[im 1/36]
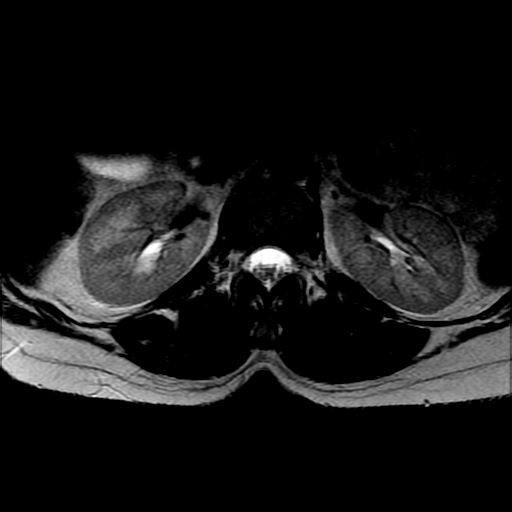
[im 6/36]
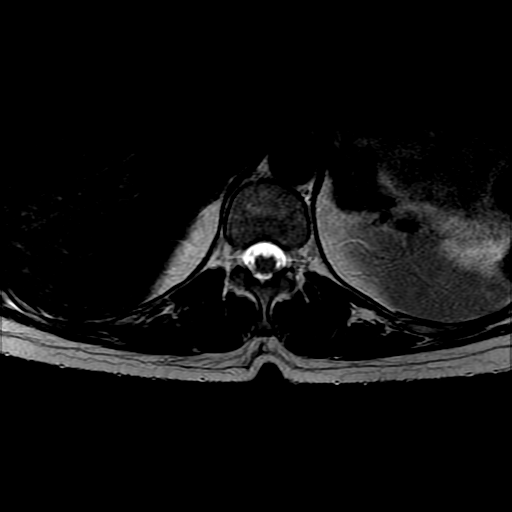
[im 11/36]
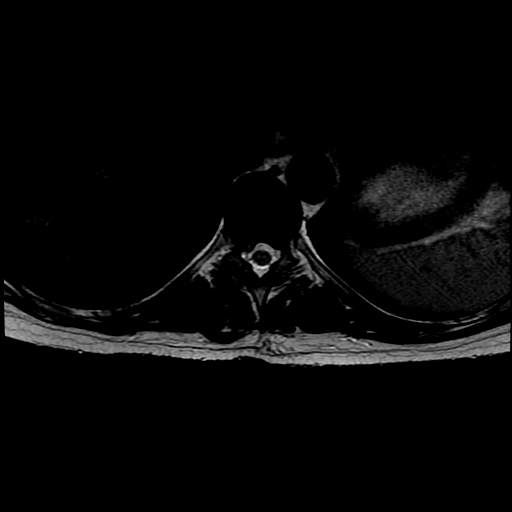
[im 16/36]
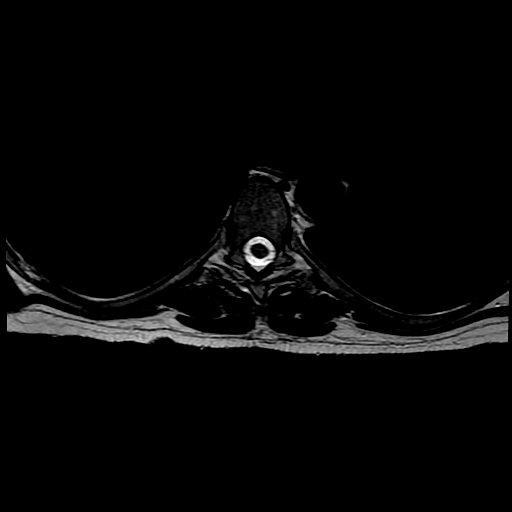
[im 21/36]
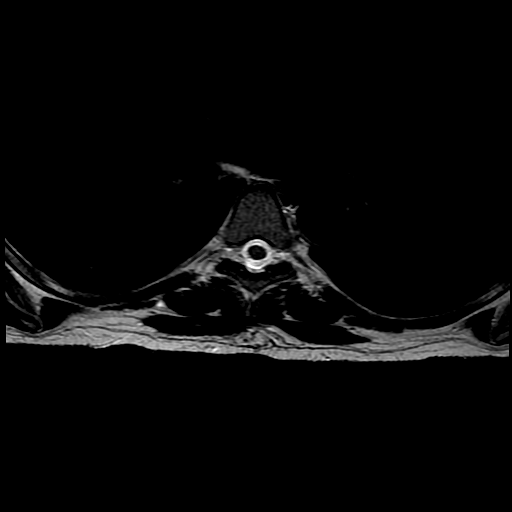
[im 26/36]
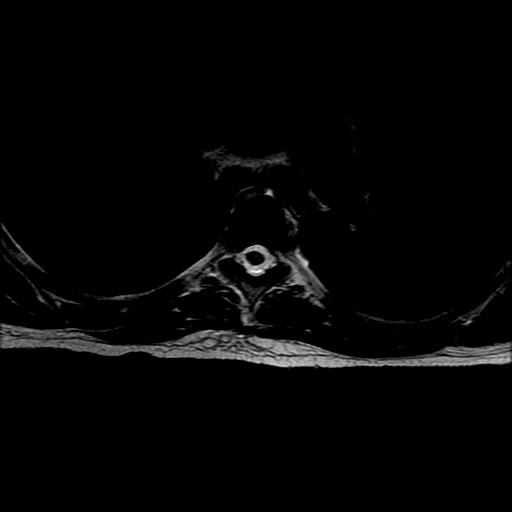
[im 31/36]
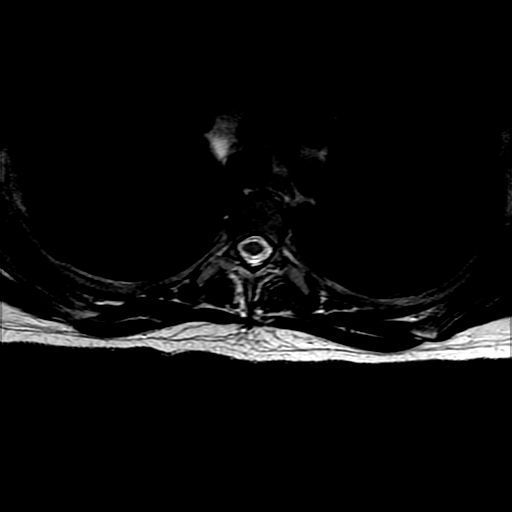

[19 of 48 positions shown; findings below may reference images not displayed]

FINDINGS: MRI CERVICAL SPINE FINDINGS

Alignment: Grade 1 anterolisthesis at C7-T1.

Vertebrae: No fracture, evidence of discitis, or bone lesion.

Cord: Normal signal and morphology.

Posterior Fossa, vertebral arteries, paraspinal tissues: Negative.

Disc levels:

C1-C2: Normal.

C2-C3: Normal disc space and facets. No spinal canal or
neuroforaminal stenosis.

C3-C4: Small disc osteophyte complex results in mild spinal canal
stenosis. Mild bilateral foraminal narrowing.

C4-C5: Mild bilateral uncovertebral hypertrophy.  No stenosis.

C5-C6: Right subarticular disc protrusion narrows the ventral thecal
sac. No central spinal canal stenosis or neural foraminal stenosis.

C6-C7: Right-sided uncovertebral hypertrophy causes moderate right
neural foraminal stenosis.

C7-T1: Grade 1 anterolisthesis with pseudo disc bulge but no
stenosis.

MRI THORACIC SPINE FINDINGS

Alignment:  Physiologic.

Vertebrae: No fracture, evidence of discitis, or bone lesion.

Cord:  Normal signal and morphology.

Paraspinal and other soft tissues: Negative.

Disc levels:

No disc herniation, spinal canal stenosis or neural foraminal
stenosis.
IMPRESSION: 1. Mild spinal canal stenosis at the C3-C4 level, which has slightly
worsened compared to the 01/27/2016 study. No cord compression.
2. Moderate right C6-C7 neural foraminal stenosis. Correlate for
right C7 radiculopathy.
3. Normal MRI of the thoracic spine.

## 2019-03-05 ENCOUNTER — Ambulatory Visit (INDEPENDENT_AMBULATORY_CARE_PROVIDER_SITE_OTHER): Payer: BC Managed Care – PPO | Admitting: Neurology

## 2019-03-05 ENCOUNTER — Encounter: Payer: Self-pay | Admitting: Neurology

## 2019-03-05 ENCOUNTER — Other Ambulatory Visit: Payer: Self-pay

## 2019-03-05 VITALS — BP 119/83 | HR 88 | Temp 97.4°F | Ht 60.0 in | Wt 134.0 lb

## 2019-03-05 DIAGNOSIS — F445 Conversion disorder with seizures or convulsions: Secondary | ICD-10-CM

## 2019-03-05 DIAGNOSIS — G43909 Migraine, unspecified, not intractable, without status migrainosus: Secondary | ICD-10-CM

## 2019-03-05 DIAGNOSIS — M4802 Spinal stenosis, cervical region: Secondary | ICD-10-CM | POA: Insufficient documentation

## 2019-03-05 MED ORDER — LAMOTRIGINE 100 MG PO TABS: 100.0000 mg | ORAL_TABLET | Freq: Two times a day (BID) | ORAL | 3 refills | Status: DC

## 2019-03-05 MED ORDER — TOPIRAMATE 25 MG PO TABS: ORAL_TABLET | ORAL | 3 refills | Status: DC

## 2019-03-05 MED ORDER — SUMATRIPTAN SUCCINATE 100 MG PO TABS: 100.0000 mg | ORAL_TABLET | Freq: Once | ORAL | 5 refills | Status: DC | PRN

## 2019-03-05 MED ORDER — ESCITALOPRAM OXALATE 20 MG PO TABS: 20.0000 mg | ORAL_TABLET | Freq: Every day | ORAL | 3 refills | Status: DC

## 2019-03-05 NOTE — Progress Notes (Signed)
[facility]  PATIENT: Amy Hopkins DOB: 12/20/1965  REFERRING DOCTOR OR PCP:  Finis Bud SOURCE: Patient, notes from Dr. Dory Larsen and Dr. Trula Ore, MRI reports, lab reports, MRI images on CD  _________________________________   HISTORICAL  CHIEF COMPLAINT:  Chief Complaint  Patient presents with  . Follow-up    pt alone, rm 13. pt states things are stable. she will have intermittent "episodes" nothing that has been concerning    HISTORY OF PRESENT ILLNESS:  Amy Hopkins is a 54 y.o. woman with spells of transient alteration of awareness and shaking    Update 03/05/2019: She is having spells about 2 times a month.  With most spells her right arm stiffens up x 10-15 minutes.  She is usually sitting but not always.    They occur both work and home.    She never has LOC but feels she can't focus.     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   She did see psychiatry and medications were maintained.      She sleeps well.   She denies depression currently.     Migraines have done well on topiramate 25 mg po qHS.    She is on Lexapro and lamotrigine.     She has neck pain.  She has multilevel mild spinal stenosis mostly due to to mild degenerative changes combined with congenitally short pedicles.  The spinal cord has normal signal.  Update 12/17/2017: She reports having a couple more spells.   She had a very short episode with some shaking but no LOC while on the coach.   She had two spells yesterday.    They each lasted about 10 minutes and were similar to spells in the past.     She was conscious during the spells.   Of note, she was having a lot more LBP yesterday while she was sitting at work.   She got up to move around and the leg started to shake.   She improved but two hours later, while going to her car, she had a second episode  She saw psychiatry for one visit.   No changes in medication were made.   A main stressor is her mom had  a major stroke and is locked in .     She is still on Lexapro and lamotrigine and tolerates them well.    She is also on topiramate 25 mg nightly for migraines.    This is helping her migraines a lot. She   Update 04/08/2017: Over the past year, she has had about a dozen spells with transient alteration of awareness and shaking, right greater than left.   Additionally, in 2011, she had a generalized tonic-clonic seizure while she was on Wellbutrin and tramadol. In the intervening 6-7 years  she had no episodes.   I reviewed the findings of the EEG. It showed a nonepileptic seizure (pseudoseizure).   I evaluated her during the episode and she had alternating arm shaking at times and just shook the right side of her body at other times. There was distractibility of the motor symptoms. She had preserved consciousness. She was able to talk and follow simple commands.   This spell was similar to the spells that she has been experiencing over the past few months.  She denies any significant stressor currently in life. She also denies a history of physical or sexual abuse. She does note a history of depression and anxiety many years ago when her  mother had a stroke and she saw a psychiatrist for short period time she has been on Lexapro 10 mg for many years. She also has migraine headaches and she reports that they are doing well.   Of note, she is on lamotrigine 100 mg by mouth twice a day and Topamax 25 mg daily at bedtime for migraine headaches. Her migraines are doing very well.  Update 03/28/2017: She is having spells of altered consciousness lasting 20 minutes.     She gets right sided shaking.  No LOC and is able to talk though she does not feel completely baseline with thought.     Her head will shake on the more severe episodes.   They appear to e random without any trigger.    She has had 8 spells since last October.   A few occurred the couple weeks after Christmas and she had more stress but  others occurred with typical stress.   Spells come on suddenly but gradually gets better.    She is tired afterward and has a mild foot drop on the right for the rest of the day.     Spells always occur on her right.   Sitting will ease th spell some.   Her first spell was one year ago.   She is on lamotrigine and topiramate (migraines).      In the past, MRI showed some non-specific foci but CSF was normal.      Migraines are doing well.      From 11/07/2016:  She went to the ED 09/27/16 after presenting with right leg shaking, much worse when standing.   She also felt weaker on her right leg and was dragging it some.    I looked at a video that she took. She was standing at the time. It did not look like seizure activity. She went to the ED and had an MRI performed.   I personally looked at the images and compared to the MRI from December 2017. The current MRI shows C3C4 (mildly worse than previous MRI), C5C6 and C6C7 spinal stenosis.   There is mild anterolisthesis at C7T1.    The spinal cord appears normal with no evidence of MS or myelopathy.  Numbness/weakness:   Her right leg gets numbness up to the thigh.   The numbness comes and goes.   She feels strength is reduced intermittently, noting more weakness when she also has numbness.     She has mild urinary frequency but no nocturia.     Bending her neck does not exacerbate symptoms.      Migraines:   Her migraines are much better on Topamax 25 mg nightly    Abnormal MRI/Possible MS:  Because of a spell of transient alteration of awareness in August 2017, she had an MRI.   The MRI shows nonspecific white matter changes slightly progressed her to the 2011 MRI.  He was felt by one neurologist to have MS scribe Copaxone but never took it. She was then referred to me for second opinion. I personally reviewed the MRIs.    I believe that the changes are most likely to represent chronic microvascular ischemic change or changes from the migraine headaches. I  think demyelination from MS is significantly less likely so I did not think that she should start a disease modifying therapy.Also,   a lumbar puncture was reportedly negative.  There is no family history of MS.   Transient alteration of awareness.   She  had a seizure in 2011 and also had an episode of altered awareness in August 2017 the first episode in 2011 did have generalized tonic-clonic activity and occurred while on Wellbutrin and tramadol. . The second spell did not have any precipitating factor though it is uncertain whether it represented a seizure as there was no loss of consciousness.     She went to [facility] emergency room and was referred to Dr. Trula Ore.  The EEG was read as abnormal (right parietal and temporal slowing) and she was started on lamotrigine 100 mg by mouth twice a day. She tolerates it well. We discussed that it is uncertain about the significance of the EEG findings as a little slowing is not necessarily abnormal though it can be seen in people who have had seizures. Since lamotrigine also could help some other symptoms she will continue on it for the time being.  Migraine:   She has 3 -4 migraines a month, usually improved with  She has a long history of migraine headaches. For the most part, these are common migraine headaches without aura. She will get pounding bilateral pain, photophobia, phonophobia and nausea and sometimes vomiting. Her head will make the pain worse. Imitrex usually helps the pain.   These are actually occurring less since starting lamotrigine.  CTS:   She had a nerve conduction and EMG study performed. It showed that she had the nerve conduction study showed moderate right and mild left carpal tunnel syndrome and no-shows showed mild cervical and lumbosacral radiculopathies.    Anxiety:  She has some anxiety and is on Lexapro.  She tolerates it well.   REVIEW OF SYSTEMS: Constitutional: No fevers, chills, sweats, or change in appetite Eyes: No visual  changes, double vision, eye pain Ear, nose and throat: No hearing loss, ear pain, nasal congestion, sore throat Cardiovascular: No chest pain, palpitations Respiratory: No shortness of breath at rest or with exertion.   No wheezes GastrointestinaI: No nausea, vomiting, diarrhea, abdominal pain, fecal incontinence Genitourinary: No dysuria, urinary retention or frequency.  No nocturia. Musculoskeletal: No neck pain, back pain Integumentary: No rash, pruritus, skin lesions Neurological: as above Psychiatric: No depression at this time. She has anxiety Endocrine: No palpitations, diaphoresis, change in appetite, change in weigh or increased thirst Hematologic/Lymphatic: No anemia, purpura, petechiae. Allergic/Immunologic: No itchy/runny eyes, nasal congestion, recent allergic reactions, rashes  ALLERGIES: Allergies  Allergen Reactions  . Tramadol Other (See Comments)    Seizures     HOME MEDICATIONS:  Current Outpatient Medications:  .  acetaminophen (TYLENOL) 500 MG tablet, Take 1,000 mg by mouth every 6 (six) hours as needed for mild pain. , Disp: , Rfl:  .  aspirin 81 MG chewable tablet, Chew 81 mg by mouth daily., Disp: , Rfl:  .  escitalopram (LEXAPRO) 20 MG tablet, Take 1 tablet (20 mg total) by mouth daily., Disp: 90 tablet, Rfl: 3 .  ibuprofen (ADVIL,MOTRIN) 200 MG tablet, Take 200 mg by mouth every 6 (six) hours as needed for mild pain. , Disp: , Rfl:  .  lamoTRIgine (LAMICTAL) 100 MG tablet, Take 1 tablet (100 mg total) by mouth 2 (two) times daily., Disp: 180 tablet, Rfl: 3 .  SUMAtriptan (IMITREX) 100 MG tablet, Take 1 tablet (100 mg total) by mouth once as needed for up to 1 dose for migraine. May repeat in 2 hours if headache persists or recurs., Disp: 10 tablet, Rfl: 5 .  topiramate (TOPAMAX) 25 MG tablet, TAKE 1 TABLET BY MOUTH EVERYDAY AT  BEDTIME, Disp: 90 tablet, Rfl: 3  Current Facility-Administered Medications:  .  0.9 %  sodium chloride infusion, 500 mL,  Intravenous, Continuous, Armbruster, Carlota Raspberry, MD  PAST MEDICAL HISTORY: Past Medical History:  Diagnosis Date  . Allergy   . Anxiety   . Cancer ([place])   . Headache   . Melanoma ([place])   . Multiple sclerosis ([place])    not sure if so at this point- ? misdiagnosed   . Osteopenia   . Psoriasis   . Seizures ([place])    last seizure 2011 per pt in [facility] 11-27-2016  . Vision abnormalities     PAST SURGICAL HISTORY: Past Surgical History:  Procedure Laterality Date  . BREAST BIOPSY  1993   benign  . MELANOMA EXCISION    . MYOMECTOMY  2012  . wisdom teeth      FAMILY HISTORY: Family History  Problem Relation Age of Onset  . Stroke Mother   . Breast cancer Mother   . Hypertension Father   . High Cholesterol Father   . Psoriasis Father   . Healthy Brother   . Transient ischemic attack Maternal Grandmother   . Esophageal cancer Maternal Grandfather   . Colon cancer Neg Hx   . Colon polyps Neg Hx   . Rectal cancer Neg Hx   . Stomach cancer Neg Hx     SOCIAL HISTORY:  Social History   Socioeconomic History  . Marital status: Married    Spouse name: Dominica Severin  . Number of children: Not on file  . Years of education: Not on file  . Highest education level: Not on file  Occupational History  . Not on file  Tobacco Use  . Smoking status: Former Smoker    Quit date: 1996    Years since quitting: 25.0  . Smokeless tobacco: Never Used  Substance and Sexual Activity  . Alcohol use: Yes    Comment: occasional  . Drug use: No  . Sexual activity: Yes  Other Topics Concern  . Not on file  Social History Narrative  . Not on file   Social Determinants of Health   Financial Resource Strain:   . Difficulty of Paying Living Expenses: Not on file  Food Insecurity:   . Worried About Charity fundraiser in the Last Year: Not on file  . Ran Out of Food in the Last Year: Not on file  Transportation Needs:   . Lack of Transportation (Medical): Not on file  . Lack of Transportation  (Non-Medical): Not on file  Physical Activity:   . Days of Exercise per Week: Not on file  . Minutes of Exercise per Session: Not on file  Stress:   . Feeling of Stress : Not on file  Social Connections:   . Frequency of Communication with Friends and Family: Not on file  . Frequency of Social Gatherings with Friends and Family: Not on file  . Attends Religious Services: Not on file  . Active Member of Clubs or Organizations: Not on file  . Attends Archivist Meetings: Not on file  . Marital Status: Not on file  Intimate Partner Violence:   . Fear of Current or Ex-Partner: Not on file  . Emotionally Abused: Not on file  . Physically Abused: Not on file  . Sexually Abused: Not on file     PHYSICAL EXAM  Vitals:   03/05/19 1554  BP: 119/83  Pulse: 88  Temp: (!) 97.4 F (36.3 C)  Weight:  134 lb (60.8 kg)  Height: 5' (1.524 m)    Body mass index is 26.17 kg/m.   General: The patient is well-developed and well-nourished and in no acute distress   Neurologic Exam:  Mental status: She is alert and fully oriented.. The patient has apparent normal recent and remote memory, with an apparently normal attention span and concentration ability.   Speech is normal.  Cranial nerves: Extraocular movements are full.  Facial strength was normal.  The tongue is midline, and the patient has symmetric elevation of the soft palate. No obvious hearing deficits are noted.  Motor:  Muscle bulk is normal.   Tone is normal. Strength appeared to be normal and symmetric.    Gait and station: Station is normal.   Gait and tandem gait are normal.  Romberg is negative. Reflexes: Deep tendon reflexes are symmetric in the arms.         DIAGNOSTIC DATA (LABS, IMAGING, TESTING) - I reviewed patient records, labs, notes, testing and imaging myself where available.      ASSESSMENT AND PLAN  Migraine without status migrainosus, not intractable, unspecified migraine type  Psychogenic  nonepileptic seizure  Cervical spinal stenosis   1.    She has spells of right arm cramping/spasm without loss of consciousness.  In the past, symptoms were more bilateral.  She had a typical spell doing an EEG and there was no electrical evidence of seizure.  MRI has not shown any myelopathic signal in the cervical spinal cord though she does have spinal stenosis. 2.   Continue Lexapro, lamotrigine for depression and mood stabilization. 3.   Continue topiramate at night for migraines with sumatriptan as needed 4.    Return to clinic in 12 months or sooner if there are new or worsening  neurologic symptoms    Lesli Issa A. Felecia Shelling, MD, PhD [id number], 0000000 PM Certified in Neurology, Clinical Neurophysiology, Sleep Medicine, Pain Medicine and Neuroimaging  [facility] [address], [place], [place] [address] [phone]

## 2019-05-08 ENCOUNTER — Ambulatory Visit: Payer: Self-pay | Attending: Internal Medicine

## 2019-05-08 DIAGNOSIS — Z23 Encounter for immunization: Secondary | ICD-10-CM

## 2019-05-08 NOTE — Progress Notes (Signed)
   Covid-19 Vaccination Clinic  Name:  Kerly Wears    MRN: [id number] DOB: January 31, 1966  05/08/2019  Ms. Abbett was observed post Covid-19 immunization for 15 minutes without incident. She was provided with Vaccine Information Sheet and instruction to access the V-Safe system.   Ms. Lobell was instructed to call 911 with any severe reactions post vaccine: Marland Kitchen Difficulty breathing  . Swelling of face and throat  . A fast heartbeat  . A bad rash all over body  . Dizziness and weakness   Immunizations Administered    Name Date Dose VIS Date Route   Pfizer COVID-19 Vaccine 05/08/2019  8:44 AM 0.3 mL 01/30/2019 Intramuscular   Manufacturer: [place]   Lot: [id number]   [place]: [id number]

## 2019-05-08 NOTE — Progress Notes (Signed)
   Covid-19 Vaccination Clinic  Name:  Amy Hopkins    MRN: [id number] DOB: [birth year]  05/08/2019  [name] was observed post Covid-19 immunization for 15 minutes without incident. She was provided with Vaccine Information Sheet and instruction to access the V-Safe system.   [name] was instructed to call 911 with any severe reactions post vaccine: Marland Kitchen Difficulty breathing  . Swelling of face and throat  . A fast heartbeat  . A bad rash all over body  . Dizziness and weakness   Immunizations Administered    Name Date Dose VIS Date Route   Pfizer COVID-19 Vaccine 05/08/2019  8:44 AM 0.3 mL 01/30/2019 Intramuscular   Manufacturer: [place]   Lot: [id number]   [place]: [id number]

## 2019-06-02 ENCOUNTER — Ambulatory Visit: Payer: Self-pay | Attending: Internal Medicine

## 2019-06-02 DIAGNOSIS — Z23 Encounter for immunization: Secondary | ICD-10-CM

## 2019-06-02 NOTE — Progress Notes (Signed)
   Covid-19 Vaccination Clinic  Name:  Miasia Headman    MRN: [id number] DOB: 1965/03/20  06/02/2019  Ms. Sloas was observed post Covid-19 immunization for 15 minutes without incident. She was provided with Vaccine Information Sheet and instruction to access the V-Safe system.   Ms. Raney was instructed to call 911 with any severe reactions post vaccine: Marland Kitchen Difficulty breathing  . Swelling of face and throat  . A fast heartbeat  . A bad rash all over body  . Dizziness and weakness   Immunizations Administered    Name Date Dose VIS Date Route   Pfizer COVID-19 Vaccine 06/02/2019  9:16 AM 0.3 mL 01/30/2019 Intramuscular   Manufacturer: [place]   Lot: [id number]   [place]: [id number]

## 2020-03-06 ENCOUNTER — Other Ambulatory Visit: Payer: Self-pay | Admitting: Neurology

## 2020-03-07 ENCOUNTER — Other Ambulatory Visit: Payer: Self-pay | Admitting: Neurology

## 2020-03-08 ENCOUNTER — Telehealth (INDEPENDENT_AMBULATORY_CARE_PROVIDER_SITE_OTHER): Payer: BC Managed Care – PPO | Admitting: Neurology

## 2020-03-08 ENCOUNTER — Encounter: Payer: Self-pay | Admitting: Neurology

## 2020-03-08 DIAGNOSIS — G43909 Migraine, unspecified, not intractable, without status migrainosus: Secondary | ICD-10-CM | POA: Diagnosis not present

## 2020-03-08 DIAGNOSIS — M4802 Spinal stenosis, cervical region: Secondary | ICD-10-CM | POA: Diagnosis not present

## 2020-03-08 DIAGNOSIS — F445 Conversion disorder with seizures or convulsions: Secondary | ICD-10-CM | POA: Diagnosis not present

## 2020-03-08 MED ORDER — TOPIRAMATE 25 MG PO TABS: ORAL_TABLET | ORAL | 4 refills | Status: DC

## 2020-03-08 MED ORDER — LAMOTRIGINE 100 MG PO TABS: 100.0000 mg | ORAL_TABLET | Freq: Two times a day (BID) | ORAL | 3 refills | Status: DC

## 2020-03-08 MED ORDER — SUMATRIPTAN SUCCINATE 100 MG PO TABS
100.0000 mg | ORAL_TABLET | Freq: Once | ORAL | 5 refills | Status: DC | PRN
Start: 2020-03-08 — End: 2021-03-06

## 2020-03-08 NOTE — Progress Notes (Signed)
[facility]  PATIENT: Amy Hopkins DOB: 17-May-1965  REFERRING DOCTOR OR PCP:  Finis Bud SOURCE: Patient, notes from Dr. Dory Larsen and Dr. Trula Ore, MRI reports, lab reports, MRI images on CD  _________________________________   HISTORICAL  CHIEF COMPLAINT:  Chief Complaint  Patient presents with  . Migraine  . Other    spells    HISTORY OF PRESENT ILLNESS:  Amy Hopkins is a 55 y.o. woman with spells of transient alteration of awareness and shaking    Virtual Visit via Video Note I connected with Amy Hopkins on 03/08/20 at  4:00 PM EST by MyChart Video.  There was sounds but she was unable to get the video to work.  She disconnected and reconnected and there was still technical problems.  Therefore, I called her and we completed the visit. and verified that I am speaking with the correct person using two identifiers.  Location: Patient: Home Provider: Office   I discussed the limitations, risks, security and privacy concerns of performing an evaluation and management service by telephone and the availability of in person appointments. I also discussed with the patient that there may be a patient responsible charge related to this service. The patient expressed understanding and agreed to proceed.  History of Present Illness: Update 03/08/2020: She feels she is doing well in general.  She has had about 2 small episodes with shaking but no severe ones like in 2019.  None were associated with LOC  She continues to have migraines.  She is taking topiramate 25 mg qHS.  She takes Imitrex a few times a month.  She denies depression.  She is still on Lexapro and lamotrigine  She went roller skating and broke her right wrist/ankle.  She is doing well well now   She has neck pain.  She has multilevel mild spinal stenosis mostly due to to mild degenerative changes combined with congenitally short pedicles.  The spinal cord has normal signal.  MRI of the  cervical spine 09/27/2016 had shown mild spinal stenosis at C3-C4 and moderate right foraminal narrowing at C6-C7.  Spells of shaking and/or altered awareness: She had a seizure in 2011 with generalized tonic-clonic activity that occurred while on Wellbutrin and tramadol.  Starting in 2017, she began to experience some spells with altered awareness and sometimes some shaking but not loss of consciousness.  She had one EEG that was read as abnormal with right parietal and temporal slowing and she was started on lamotrigine.  We did an EEG 04/08/2017.  With hyperventilation she had shaking on the right side of her body but no loss of consciousness.  There is no seizure activity on EEG.  This lasted 15 minutes and I witnessed it.  It was consistent with psychogenic or nonepileptiform seizure.  Migraine history:   She has a long history of migraine headaches since her teen years. For the most part, these are common migraine headaches without aura. She will get pounding bilateral pain, photophobia, phonophobia and nausea and sometimes vomiting. Her head will make the pain worse.   On a combination of lamotrigine and Topamax, headaches occur 2-4 times a month.  Imitrex will help if 1 occurs.    Abnormal MRI/Possible MS:   Because of a spell of transient alteration of awareness in August 2017, she had an MRI.   The MRI shows nonspecific white matter changes slightly progressed her to the 2011 MRI.  An outside neurologist diagnosed her with MS and prescribed Copaxone but she  did not take it.  I personally reviewed the MRIs.    I believe that the changes most likely  represent mild chronic microvascular ischemic change or changes from the migraine headaches. I think demyelination from MS is significantly less likely so I did not think that she should start a disease modifying therapy.  Also, a lumbar puncture was reportedly negative.  There is no family history of MS.    Observations/Objective: She was alert and  fully oriented with fluent speech and good attention, knowledge and memory  Assessment and Plan: Migraine without status migrainosus, not intractable, unspecified migraine type  Psychogenic nonepileptic seizure  Cervical spinal stenosis  1.  As she is stable with few episodes of shaking and fairly well-controlled headaches she will continue lamotrigine 100 mg p.o. twice daily and Topamax 25 mg p.o. nightly.  Imitrex for breakthrough headaches.  She reports that her primary care provider was not comfortable taking over prescriptions. 2.   Stay active and exercise as tolerated. 3.   Return in 1 year or sooner if there are new or worsening neurologic symptoms.  Follow Up Instructions: I discussed the assessment and treatment plan with the patient. The patient was provided an opportunity to ask questions and all were answered. The patient agreed with the plan and demonstrated an understanding of the instructions.   The patient was advised to call back or seek an in-person evaluation if the symptoms worsen or if the condition fails to improve as anticipated.  I provided 30 minutes of non-face-to-face time during this encounter, including record review and documentation.   Britt Bottom, MD   REVIEW OF SYSTEMS: Constitutional: No fevers, chills, sweats, or change in appetite Eyes: No visual changes, double vision, eye pain Ear, nose and throat: No hearing loss, ear pain, nasal congestion, sore throat Cardiovascular: No chest pain, palpitations Respiratory: No shortness of breath at rest or with exertion.   No wheezes GastrointestinaI: No nausea, vomiting, diarrhea, abdominal pain, fecal incontinence Genitourinary: No dysuria, urinary retention or frequency.  No nocturia. Musculoskeletal: No neck pain, back pain Integumentary: No rash, pruritus, skin lesions Neurological: as above Psychiatric: No depression at this time. She has anxiety Endocrine: No palpitations, diaphoresis,  change in appetite, change in weigh or increased thirst Hematologic/Lymphatic: No anemia, purpura, petechiae. Allergic/Immunologic: No itchy/runny eyes, nasal congestion, recent allergic reactions, rashes  ALLERGIES: Allergies  Allergen Reactions  . Tramadol Other (See Comments)    Seizures     HOME MEDICATIONS:  Current Outpatient Medications:  .  acetaminophen (TYLENOL) 500 MG tablet, Take 1,000 mg by mouth every 6 (six) hours as needed for mild pain. , Disp: , Rfl:  .  aspirin 81 MG chewable tablet, Chew 81 mg by mouth daily., Disp: , Rfl:  .  escitalopram (LEXAPRO) 20 MG tablet, TAKE 1 TABLET BY MOUTH EVERY DAY, Disp: 90 tablet, Rfl: 3 .  ibuprofen (ADVIL,MOTRIN) 200 MG tablet, Take 200 mg by mouth every 6 (six) hours as needed for mild pain. , Disp: , Rfl:  .  lamoTRIgine (LAMICTAL) 100 MG tablet, Take 1 tablet (100 mg total) by mouth 2 (two) times daily., Disp: 180 tablet, Rfl: 3 .  SUMAtriptan (IMITREX) 100 MG tablet, Take 1 tablet (100 mg total) by mouth once as needed for up to 1 dose for migraine. May repeat in 2 hours if headache persists or recurs., Disp: 10 tablet, Rfl: 5 .  topiramate (TOPAMAX) 25 MG tablet, TAKE 1 TABLET BY MOUTH EVERYDAY AT BEDTIME, Disp: 30 tablet,  Rfl: 11  Current Facility-Administered Medications:  .  0.9 %  sodium chloride infusion, 500 mL, Intravenous, Continuous, Armbruster, Carlota Raspberry, MD  PAST MEDICAL HISTORY: Past Medical History:  Diagnosis Date  . Allergy   . Anxiety   . Cancer ([place])   . Headache   . Melanoma ([place])   . Multiple sclerosis ([place])    not sure if so at this point- ? misdiagnosed   . Osteopenia   . Psoriasis   . Seizures ([place])    last seizure 2011 per pt in [facility] 11-27-2016  . Vision abnormalities     PAST SURGICAL HISTORY: Past Surgical History:  Procedure Laterality Date  . BREAST BIOPSY  1993   benign  . MELANOMA EXCISION    . MYOMECTOMY  2012  . wisdom teeth      FAMILY HISTORY: Family History  Problem  Relation Age of Onset  . Stroke Mother   . Breast cancer Mother   . Hypertension Father   . High Cholesterol Father   . Psoriasis Father   . Healthy Brother   . Transient ischemic attack Maternal Grandmother   . Esophageal cancer Maternal Grandfather   . Colon cancer Neg Hx   . Colon polyps Neg Hx   . Rectal cancer Neg Hx   . Stomach cancer Neg Hx        Zachary Lovins A. Felecia Shelling, MD, PhD 9/51/8841, 6:60 PM Certified in Neurology, Clinical Neurophysiology, Sleep Medicine, Pain Medicine and Neuroimaging  [facility] [address] [place], [place] [address] [phone]

## 2021-03-06 ENCOUNTER — Ambulatory Visit (INDEPENDENT_AMBULATORY_CARE_PROVIDER_SITE_OTHER): Payer: BC Managed Care – PPO | Admitting: Family Medicine

## 2021-03-06 ENCOUNTER — Encounter: Payer: Self-pay | Admitting: Family Medicine

## 2021-03-06 VITALS — BP 106/70 | HR 84 | Ht 60.0 in | Wt 141.0 lb

## 2021-03-06 DIAGNOSIS — G43909 Migraine, unspecified, not intractable, without status migrainosus: Secondary | ICD-10-CM

## 2021-03-06 DIAGNOSIS — F445 Conversion disorder with seizures or convulsions: Secondary | ICD-10-CM

## 2021-03-06 MED ORDER — TOPIRAMATE 25 MG PO TABS: ORAL_TABLET | ORAL | 4 refills | Status: DC

## 2021-03-06 MED ORDER — ESCITALOPRAM OXALATE 20 MG PO TABS: 20.0000 mg | ORAL_TABLET | Freq: Every day | ORAL | 3 refills | Status: DC

## 2021-03-06 MED ORDER — SUMATRIPTAN SUCCINATE 100 MG PO TABS: 100.0000 mg | ORAL_TABLET | Freq: Once | ORAL | 5 refills | Status: DC | PRN

## 2021-03-06 MED ORDER — LAMOTRIGINE 100 MG PO TABS: 100.0000 mg | ORAL_TABLET | Freq: Two times a day (BID) | ORAL | 3 refills | Status: DC

## 2021-03-06 NOTE — Patient Instructions (Addendum)
Below is our plan:  We will continue lamotrigine 100mg  twice daily, escitalopram 20mg  daily and topiramate 25mg  at bedtime.   Please make sure you are staying well hydrated. I recommend 50-60 ounces daily. Well balanced diet and regular exercise encouraged. Consistent sleep schedule with 6-8 hours recommended.   Please continue follow up with care team as directed.   Follow up with me in 1 year   You may receive a survey regarding today's visit. I encourage you to leave honest feed back as I do use this information to improve patient care. Thank you for seeing me today!

## 2021-03-06 NOTE — Progress Notes (Signed)
Chief Complaint  Patient presents with   Follow-up    Rm 1, alone. Here for yearly migraine f/u. Pt reports migraine are the same. 2 migraines per month. Last few hours. Taking Topamax nightly and sumatriptan prn.      HISTORY OF PRESENT ILLNESS:  03/06/21 ALL:  Amy Hopkins is a 56 y.o. female here today for follow up for migraines and nonepileptic events. She continues lamotrigine 100mg  BID and topirmate 25mg  QHS. She continues sumatriptan for abortive therapy. She may use 2-3 times a month at most. It works well for abortive therapy. She also continues escitalopram 20mg  and lamotrigine 100mg  twice daily for mood management. No altered awareness events. She was recently started on statin and continues asa. She has a family history of stroke (mother and brother).   HISTORY (copied from Amy Hopkins previous note)  03/08/2020 Mychart: She feels she is doing well in general.  She has had about 2 small episodes with shaking but no severe ones like in 2019.  None were associated with LOC   She continues to have migraines.  She is taking topiramate 25 mg qHS.  She takes Imitrex a few times a month.   She denies depression.  She is still on Lexapro and lamotrigine   She went roller skating and broke her right wrist/ankle.  She is doing well well now    She has neck pain.  She has multilevel mild spinal stenosis mostly due to to mild degenerative changes combined with congenitally short pedicles.  The spinal cord has normal signal.  MRI of the cervical spine 09/27/2016 had shown mild spinal stenosis at C3-C4 and moderate right foraminal narrowing at C6-C7.   Spells of shaking and/or altered awareness: She had a seizure in 2011 with generalized tonic-clonic activity that occurred while on Wellbutrin and tramadol.  Starting in 2017, she began to experience some spells with altered awareness and sometimes some shaking but not loss of consciousness.  She had one EEG that was read as abnormal with  right parietal and temporal slowing and she was started on lamotrigine.  We did an EEG 04/08/2017.  With hyperventilation she had shaking on the right side of her body but no loss of consciousness.  There is no seizure activity on EEG.  This lasted 15 minutes and I witnessed it.  It was consistent with psychogenic or nonepileptiform seizure.   Migraine history:   She has a long history of migraine headaches since her teen years. For the most part, these are common migraine headaches without aura. She will get pounding bilateral pain, photophobia, phonophobia and nausea and sometimes vomiting. Her head will make the pain worse.   On a combination of lamotrigine and Topamax, headaches occur 2-4 times a month.  Imitrex will help if 1 occurs.     Abnormal MRI/Possible MS:   Because of a spell of transient alteration of awareness in August 2017, she had an MRI.   The MRI shows nonspecific white matter changes slightly progressed her to the 2011 MRI.  An outside neurologist diagnosed her with MS and prescribed Copaxone but she did not take it.  I personally reviewed the MRIs.    I believe that the changes most likely  represent mild chronic microvascular ischemic change or changes from the migraine headaches. I think demyelination from MS is significantly less likely so I did not think that she should start a disease modifying therapy.  Also, a lumbar puncture was reportedly negative.  There is  no family history of MS.   REVIEW OF SYSTEMS: Out of a complete 14 system review of symptoms, the patient complains only of the following symptoms, migraines and all other reviewed systems are negative.   ALLERGIES: Allergies  Allergen Reactions   Tramadol Other (See Comments)    Seizures      HOME MEDICATIONS: Outpatient Medications Prior to Visit  Medication Sig Dispense Refill   acetaminophen (TYLENOL) 500 MG tablet Take 1,000 mg by mouth every 6 (six) hours as needed for mild pain.      aspirin 81 MG  chewable tablet Chew 81 mg by mouth daily.     ibuprofen (ADVIL,MOTRIN) 200 MG tablet Take 200 mg by mouth every 6 (six) hours as needed for mild pain.      rosuvastatin (CRESTOR) 5 MG tablet Take 5 mg by mouth daily.     escitalopram (LEXAPRO) 20 MG tablet TAKE 1 TABLET BY MOUTH EVERY DAY 90 tablet 3   lamoTRIgine (LAMICTAL) 100 MG tablet Take 1 tablet (100 mg total) by mouth 2 (two) times daily. 180 tablet 3   SUMAtriptan (IMITREX) 100 MG tablet Take 1 tablet (100 mg total) by mouth once as needed for up to 1 dose for migraine. May repeat in 2 hours if headache persists or recurs. 10 tablet 5   topiramate (TOPAMAX) 25 MG tablet One po qHS 90 tablet 4   Facility-Administered Medications Prior to Visit  Medication Dose Route Frequency Provider Last Rate Last Admin   0.9 %  sodium chloride infusion  500 mL Intravenous Continuous Armbruster, Amy Raspberry, MD         PAST MEDICAL HISTORY: Past Medical History:  Diagnosis Date   Allergy    Anxiety    Cancer ([place])    Headache    High cholesterol    Melanoma ([place])    Multiple sclerosis ([place] [address] [place])    not sure if so at this point- ? misdiagnosed    Osteopenia    Psoriasis    Seizures ([place])    last seizure 2011 [facility] pt in [facility] 11-27-2016   Vision abnormalities      PAST SURGICAL HISTORY: Past Surgical History:  Procedure Laterality Date   BREAST BIOPSY  1993   benign   MELANOMA EXCISION     MYOMECTOMY  2012   wisdom teeth       FAMILY HISTORY: Family History  Problem Relation Age of Onset   Stroke Mother    Breast cancer Mother    Hypertension Father    High Cholesterol Father    Psoriasis Father    Healthy Brother    Transient ischemic attack Maternal Grandmother    Esophageal cancer Maternal Grandfather    Colon cancer Neg Hx    Colon polyps Neg Hx    Rectal cancer Neg Hx    Stomach cancer Neg Hx      SOCIAL HISTORY: Social History   Socioeconomic History   Marital status: Married    Spouse name: Amy Hopkins   Number of  children: Not on file   Years of education: Not on file   Highest education level: Not on file  Occupational History   Not on file  Tobacco Use   Smoking status: Former    Types: Cigarettes    Quit date: 1996    Years since quitting: 27.0   Smokeless tobacco: Never  Vaping Use   Vaping Use: Never used  Substance and Sexual Activity   Alcohol use: Yes  Comment: occasional   Drug use: No   Sexual activity: Yes  Other Topics Concern   Not on file  Social History Narrative   Not on file   Social Determinants of Health   Financial Resource Strain: Not on file  Food Insecurity: Not on file  Transportation Needs: Not on file  Physical Activity: Not on file  Stress: Not on file  Social Connections: Not on file  Intimate Partner Violence: Not on file     PHYSICAL EXAM  Vitals:   03/06/21 1522  BP: 106/70  Pulse: 84  SpO2: 97%  Weight: 141 lb (64 kg)  Height: 5' (1.524 m)   Body mass index is 27.54 kg/m.  Generalized: Well developed, in no acute distress  Cardiology: normal rate and rhythm, no murmur auscultated  Respiratory: clear to auscultation bilaterally    Neurological examination  Mentation: Alert oriented to time, place, history taking. Follows all commands speech and language fluent Cranial nerve II-XII: Pupils were equal round reactive to light. Extraocular movements were full, visual field were full on confrontational test. Facial sensation and strength were normal.  Head turning and shoulder shrug  were normal and symmetric. Motor: The motor testing reveals 5 over 5 strength of all 4 extremities. Good symmetric motor tone is noted throughout.  Gait and station: Gait is normal.    DIAGNOSTIC DATA (LABS, IMAGING, TESTING) - I reviewed patient records, labs, notes, testing and imaging myself where available.  Lab Results  Component Value Date   WBC 6.0 09/28/2016   HGB 13.3 09/28/2016   HCT 40.1 09/28/2016   MCV 90.3 09/28/2016   PLT 213  09/28/2016      Component Value Date/Time   NA 140 09/28/2016 0919   K 4.1 09/28/2016 0919   CL 109 09/28/2016 0919   CO2 24 09/28/2016 0919   GLUCOSE 128 (H) 09/28/2016 0919   BUN 9 09/28/2016 0919   CREATININE 0.87 09/28/2016 0919   CALCIUM 9.1 09/28/2016 0919   GFRNONAA >60 09/28/2016 0919   GFRAA >60 09/28/2016 0919   Lab Results  Component Value Date   CHOL 184 09/28/2016   HDL 58 09/28/2016   LDLCALC 119 (H) 09/28/2016   TRIG 36 09/28/2016   CHOLHDL 3.2 09/28/2016   Lab Results  Component Value Date   HGBA1C 5.1 09/28/2016   No results found for: VITAMINB12 No results found for: TSH  No flowsheet data found.   No flowsheet data found.   ASSESSMENT AND PLAN  56 y.o. year old female  has a past medical history of Allergy, Anxiety, Cancer ([place]), Headache, High cholesterol, Melanoma ([place]), Multiple sclerosis ([place]), Osteopenia, Psoriasis, Seizures ([place]), and Vision abnormalities. here with    Migraine without status migrainosus, not intractable, unspecified migraine type  Psychogenic nonepileptic seizure  Amy Hopkins is doing well, today. Migraines are well managed on topiramate 25mg  daily and sumatriptan as needed. She will avoid regular use of sumatriptan. Mood is well managed on lamotrigine 100mg  BID and escitalopram 20mg  daily. NO seizure like events. We will continue current treatment plan. She will continue close follow up with PCP as directed. [place] prevention reviewed. Healthy lifestyle habits encouraged. She will follow up with me in 1 year, sooner if needed.   No orders of the defined types were placed in this encounter.    Meds ordered this encounter  Medications   SUMAtriptan (IMITREX) 100 MG tablet    Sig: Take 1 tablet (100 mg total) by mouth once as needed for up to  1 dose for migraine. May repeat in 2 hours if headache persists or recurs.    Dispense:  10 tablet    Refill:  5    Order Specific Question:   Supervising Provider    Answer:   Melvenia Beam [[id number]]   lamoTRIgine (LAMICTAL) 100 MG tablet    Sig: Take 1 tablet (100 mg total) by mouth 2 (two) times daily.    Dispense:  180 tablet    Refill:  3    Order Specific Question:   Supervising Provider    Answer:   Melvenia Beam [[id number]]   escitalopram (LEXAPRO) 20 MG tablet    Sig: Take 1 tablet (20 mg total) by mouth daily.    Dispense:  90 tablet    Refill:  3    Order Specific Question:   Supervising Provider    Answer:   Melvenia Beam [[id number]]   topiramate (TOPAMAX) 25 MG tablet    Sig: One po qHS    Dispense:  90 tablet    Refill:  4    Order Specific Question:   Supervising Provider    Answer:   Melvenia Beam [[id number]]      Debbora Presto, MSN, FNP-C 03/06/2021, 3:58 PM  [facility] [address] [place], [place] [address] [phone]

## 2021-12-17 ENCOUNTER — Other Ambulatory Visit: Payer: Self-pay | Admitting: Family Medicine

## 2022-03-07 ENCOUNTER — Ambulatory Visit: Payer: BC Managed Care – PPO | Admitting: Family Medicine

## 2022-03-15 ENCOUNTER — Encounter: Payer: Self-pay | Admitting: Neurology

## 2022-03-15 ENCOUNTER — Ambulatory Visit (INDEPENDENT_AMBULATORY_CARE_PROVIDER_SITE_OTHER): Payer: BC Managed Care – PPO | Admitting: Neurology

## 2022-03-15 VITALS — BP 102/68 | HR 84 | Ht 60.0 in | Wt 128.6 lb

## 2022-03-15 DIAGNOSIS — F411 Generalized anxiety disorder: Secondary | ICD-10-CM | POA: Diagnosis not present

## 2022-03-15 DIAGNOSIS — M4802 Spinal stenosis, cervical region: Secondary | ICD-10-CM | POA: Diagnosis not present

## 2022-03-15 DIAGNOSIS — G43909 Migraine, unspecified, not intractable, without status migrainosus: Secondary | ICD-10-CM

## 2022-03-15 MED ORDER — SUMATRIPTAN SUCCINATE 100 MG PO TABS: 100.0000 mg | ORAL_TABLET | Freq: Once | ORAL | 5 refills | Status: DC | PRN

## 2022-03-15 MED ORDER — ESCITALOPRAM OXALATE 20 MG PO TABS: 20.0000 mg | ORAL_TABLET | Freq: Every day | ORAL | 3 refills | Status: DC

## 2022-03-15 MED ORDER — LAMOTRIGINE 100 MG PO TABS: 100.0000 mg | ORAL_TABLET | Freq: Two times a day (BID) | ORAL | 3 refills | Status: DC

## 2022-03-15 MED ORDER — TOPIRAMATE 25 MG PO TABS: ORAL_TABLET | ORAL | 4 refills | Status: DC

## 2022-03-15 NOTE — Progress Notes (Signed)
[facility]  PATIENT: Amy Hopkins DOB: 1965/05/04  REFERRING DOCTOR OR PCP:  Finis Bud SOURCE: Patient, notes from Dr. Dory Larsen and Dr. Trula Ore, MRI reports, lab reports, MRI images on CD  _________________________________   HISTORICAL  CHIEF COMPLAINT:  Chief Complaint  Patient presents with   Follow-up    Patient is here in room 10, alone, here for migraines on Topamax. Had bad migraine 2 weeks ago.     HISTORY OF PRESENT ILLNESS:  Amy Hopkins is a 57 y.o. woman with spells of transient alteration of awareness and shaking    UPDATE 03/15/2022 She denies any more spells with shaking - none small or large since last visit.  No large spell since 2019.    She still has migraines but only 1 a month or less.  She is taking topiramate 25 mg qHS.  She takes Imitrex a few times a month.  She denies depression and remains on Lexapro and lamotrigine.   Anxiety was worse in the 1990's (after mom's stroke) and more when her father had health issues.   Her mother is nearly locked in and still alive, living in [place] now.    Neck pain has not been a problem.   She has multilevel mild spinal stenosis mostly due to to mild degenerative changes combined with congenitally short pedicles.  The spinal cord has normal signal.  MRI of the cervical spine 09/27/2016 had shown mild spinal stenosis at C3-C4 and moderate right foraminal narrowing at C6-C7.  Spells of shaking and/or altered awareness: She had a seizure in 2011 with generalized tonic-clonic activity that occurred while on Wellbutrin and tramadol.  Starting in 2017, she began to experience some spells with altered awareness and sometimes some shaking but not loss of consciousness.  She had one EEG that was read as abnormal with right parietal and temporal slowing and she was started on lamotrigine.  We did an EEG 04/08/2017.  With hyperventilation she had shaking on the right side of her body but no loss of  consciousness.  There is no seizure activity on EEG.  This lasted 15 minutes and I witnessed it.  It was consistent with psychogenic or nonepileptiform seizure.  Migraine history:   She has a long history of migraine headaches since her teen years. For the most part, these are common migraine headaches without aura. She will get pounding bilateral pain, photophobia, phonophobia and nausea and sometimes vomiting. Her head will make the pain worse.   On a combination of lamotrigine and Topamax, headaches occur 2-4 times a month.  Imitrex will help if 1 occurs.    Abnormal MRI/Possible MS:   Because of a spell of transient alteration of awareness in August 2017, she had an MRI.   The MRI shows nonspecific white matter changes slightly progressed her to the 2011 MRI.  An outside neurologist diagnosed her with MS and prescribed Copaxone but she did not take it.  I personally reviewed the MRIs.    I believe that the changes most likely  represent mild chronic microvascular ischemic change or changes from the migraine headaches. I think demyelination from MS is significantly less likely so I did not think that she should start a disease modifying therapy.  Also, a lumbar puncture was reportedly negative.  There is no family history of MS.     REVIEW OF SYSTEMS: Constitutional: No fevers, chills, sweats, or change in appetite Eyes: No visual changes, double vision, eye pain Ear, nose and throat:  No hearing loss, ear pain, nasal congestion, sore throat Cardiovascular: No chest pain, palpitations Respiratory:  No shortness of breath at rest or with exertion.   No wheezes GastrointestinaI: No nausea, vomiting, diarrhea, abdominal pain, fecal incontinence Genitourinary:  No dysuria, urinary retention or frequency.  No nocturia. Musculoskeletal:  No neck pain, back pain Integumentary: No rash, pruritus, skin lesions Neurological: as above Psychiatric: No depression at this time. She has anxiety Endocrine:  No palpitations, diaphoresis, change in appetite, change in weigh or increased thirst Hematologic/Lymphatic:  No anemia, purpura, petechiae. Allergic/Immunologic: No itchy/runny eyes, nasal congestion, recent allergic reactions, rashes  ALLERGIES: Allergies  Allergen Reactions   Tramadol Other (See Comments)    Seizures     HOME MEDICATIONS:  Current Outpatient Medications:    acetaminophen (TYLENOL) 500 MG tablet, Take 1,000 mg by mouth every 6 (six) hours as needed for mild pain. , Disp: , Rfl:    aspirin 81 MG chewable tablet, Chew 81 mg by mouth daily., Disp: , Rfl:    ibuprofen (ADVIL,MOTRIN) 200 MG tablet, Take 200 mg by mouth every 6 (six) hours as needed for mild pain. , Disp: , Rfl:    rosuvastatin (CRESTOR) 5 MG tablet, Take 5 mg by mouth daily., Disp: , Rfl:    escitalopram (LEXAPRO) 20 MG tablet, Take 1 tablet (20 mg total) by mouth daily., Disp: 90 tablet, Rfl: 3   lamoTRIgine (LAMICTAL) 100 MG tablet, Take 1 tablet (100 mg total) by mouth 2 (two) times daily., Disp: 180 tablet, Rfl: 3   SUMAtriptan (IMITREX) 100 MG tablet, Take 1 tablet (100 mg total) by mouth once as needed for up to 1 dose for migraine. May repeat in 2 hours if headache persists or recurs., Disp: 10 tablet, Rfl: 5   topiramate (TOPAMAX) 25 MG tablet, One po qHS, Disp: 90 tablet, Rfl: 4  Current Facility-Administered Medications:    0.9 %  sodium chloride infusion, 500 mL, Intravenous, Continuous, Armbruster, Carlota Raspberry, MD  PAST MEDICAL HISTORY: Past Medical History:  Diagnosis Date   Allergy    Anxiety    Cancer ([place])    Headache    High cholesterol    Melanoma ([place])    Multiple sclerosis ([place])    not sure if so at this point- ? misdiagnosed    Osteopenia    Psoriasis    Seizures ([place])    last seizure 2011 per pt in [facility] 11-27-2016   Vision abnormalities     PAST SURGICAL HISTORY: Past Surgical History:  Procedure Laterality Date   BREAST BIOPSY  1993   benign   MELANOMA EXCISION      MYOMECTOMY  2012   wisdom teeth      FAMILY HISTORY: Family History  Problem Relation Age of Onset   Stroke Mother    Breast cancer Mother    Hypertension Father    High Cholesterol Father    Psoriasis Father    Healthy Brother    Transient ischemic attack Maternal Grandmother    Esophageal cancer Maternal Grandfather    Colon cancer Neg Hx    Colon polyps Neg Hx    Rectal cancer Neg Hx    Stomach cancer Neg Hx     SOCIAL HISTORY:  Social History   Socioeconomic History   Marital status: Married    Spouse name: Dominica Severin   Number of children: Not on file   Years of education: Not on file   Highest education level: Not on file  Occupational History  Not on file  Tobacco Use   Smoking status: Former    Types: Cigarettes    Quit date: 1996    Years since quitting: 28.0   Smokeless tobacco: Never  Vaping Use   Vaping Use: Never used  Substance and Sexual Activity   Alcohol use: Yes    Comment: occasional   Drug use: No   Sexual activity: Yes  Other Topics Concern   Not on file  Social History Narrative   Not on file   Social Determinants of Health   Financial Resource Strain: Not on file  Food Insecurity: Not on file  Transportation Needs: Not on file  Physical Activity: Not on file  Stress: Not on file  Social Connections: Not on file  Intimate Partner Violence: Not on file     PHYSICAL EXAM  Vitals:   03/15/22 0815  BP: 102/68  Pulse: 84  Weight: 128 lb 9.6 oz (58.3 kg)  Height: 5' (1.524 m)    Body mass index is 25.12 kg/m.   General: The patient is well-developed and well-nourished and in no acute distress.  Good ROM in neck   Neurologic Exam:  Mental status: She is alert and fully oriented.. The patient has apparent normal recent and remote memory, with an apparently normal attention span and concentration ability.   Speech is normal.  Cranial nerves: Extraocular movements are full.  Facial strength was normal.  Facial sensation was  norma;.  No obvious hearing deficits are noted.  Motor:  Muscle bulk is normal.   Tone is normal. Strength appeared to be normal and symmetric.    Gait and station: Station is normal.   Gait and tandem gait are normal.  Romberg is negative.  Reflexes: Deep tendon reflexes are symmetric in the arms and legs.            ASSESSMENT AND PLAN  Migraine without status migrainosus, not intractable, unspecified migraine type  Anxiety state  Cervical spinal stenosis   1.    She has done well the past year and prefers to stay on current therapies. 2.   Continue Lexapro, lamotrigine for depression and mood stabilization. 3.   Continue topiramate at night for migraines with sumatriptan as needed 4.    Return to clinic in 12 months or sooner if there are new or worsening  neurologic symptoms    Kwali Wrinkle A. Felecia Shelling, MD, PhD 0/63/0160, 1:09 AM Certified in Neurology, Clinical Neurophysiology, Sleep Medicine, Pain Medicine and Neuroimaging  [facility] [address], [place], [place] [address] [phone]

## 2022-04-02 ENCOUNTER — Other Ambulatory Visit: Payer: Self-pay | Admitting: *Deleted

## 2022-04-02 MED ORDER — TOPIRAMATE 25 MG PO TABS: 25.0000 mg | ORAL_TABLET | Freq: Every day | ORAL | 4 refills | Status: DC

## 2022-05-24 ENCOUNTER — Ambulatory Visit: Payer: BC Managed Care – PPO | Admitting: Neurology

## 2023-03-13 ENCOUNTER — Telehealth: Payer: Self-pay | Admitting: Neurology

## 2023-03-13 NOTE — Telephone Encounter (Signed)
Sick, will call back to r/s appointment

## 2023-03-14 ENCOUNTER — Ambulatory Visit: Payer: BC Managed Care – PPO | Admitting: Family Medicine

## 2023-03-23 ENCOUNTER — Other Ambulatory Visit: Payer: Self-pay | Admitting: Neurology

## 2023-03-28 NOTE — Telephone Encounter (Signed)
 Pt called to r/s her yr f/u Pt is wanting to know when this medication can be called in for her.

## 2023-03-28 NOTE — Telephone Encounter (Signed)
 Called pt and informed her refill sent in. She verbalized understanding.

## 2023-04-01 ENCOUNTER — Other Ambulatory Visit (HOSPITAL_COMMUNITY): Payer: Self-pay

## 2023-04-22 ENCOUNTER — Other Ambulatory Visit: Payer: Self-pay | Admitting: Neurology

## 2023-05-14 NOTE — Patient Instructions (Signed)
 Below is our plan:  We will continue current treatment plan.   According to [place] law, you can not drive unless you are seizure / syncope free for at least 6 months and under physician's care.  Please maintain precautions. Do not participate in activities where a loss of awareness could harm you or someone else. No swimming alone, no tub bathing, no hot tubs, no driving, no operating motorized vehicles (cars, ATVs, motocycles, etc), lawnmowers, power tools or firearms. No standing at heights, such as rooftops, ladders or stairs. Avoid hot objects such as stoves, heaters, open fires. Wear a helmet when riding a bicycle, scooter, skateboard, etc. and avoid areas of traffic. Set your water heater to 120 degrees or less.  SUDEP is the sudden, unexpected death of someone with epilepsy, who was otherwise healthy. In SUDEP cases, no other cause of death is found when an autopsy is done. Each year, more than 1 in 1,000 people with epilepsy die from SUDEP. This is the leading cause of death in people with uncontrolled seizures. Until further answers are available, the best way to prevent SUDEP is to lower your risk by controlling seizures. Research has found that people with all types of epilepsy that experience convulsive seizures can be at risk.  Please make sure you are staying well hydrated. I recommend 50-60 ounces daily. Well balanced diet and regular exercise encouraged. Consistent sleep schedule with 6-8 hours recommended.   Please continue follow up with care team as directed.   Follow up with me in 1 year   You may receive a survey regarding today's visit. I encourage you to leave honest feed back as I do use this information to improve patient care. Thank you for seeing me today!   GENERAL HEADACHE INFORMATION:   Natural supplements: Magnesium Oxide or Magnesium Glycinate 500 mg at bed (up to 800 mg daily) Coenzyme Q10 300 mg in AM Vitamin B2- 200 mg twice a day   Add 1 supplement at a time  since even natural supplements can have undesirable side effects. You can sometimes buy supplements cheaper (especially Coenzyme Q10) at www.WebmailGuide.co.za or at [facility].  Migraine with aura: There is increased risk for stroke in women with migraine with aura and a contraindication for the combined contraceptive pill for use by women who have migraine with aura. The risk for women with migraine without aura is lower. However other risk factors like smoking are far more likely to increase stroke risk than migraine. There is a recommendation for no smoking and for the use of OCPs without estrogen such as progestogen only pills particularly for women with migraine with aura.Marland Kitchen People who have migraine headaches with auras may be 3 times more likely to have a stroke caused by a blood clot, compared to migraine patients who don't see auras. Women who take hormone-replacement therapy may be 30 percent more likely to suffer a clot-based stroke than women not taking medication containing estrogen. Other risk factors like smoking and high blood pressure may be  much more important.    Vitamins and herbs that show potential:   Magnesium: Magnesium (250 mg twice a day or 500 mg at bed) has a relaxant effect on smooth muscles such as blood vessels. Individuals suffering from frequent or daily headache usually have low magnesium levels which can be increase with daily supplementation of 400-750 mg. Three trials found 40-90% average headache reduction  when used as a preventative. Magnesium may help with headaches are aura, the best evidence for  magnesium is for migraine with aura is its thought to stop the cortical spreading depression we believe is the pathophysiology of migraine aura.Magnesium also demonstrated the benefit in menstrually related migraine.  Magnesium is part of the messenger system in the serotonin cascade and it is a good muscle relaxant.  It is also useful for constipation which can be a side effect of other  medications used to treat migraine. Good sources include nuts, whole grains, and tomatoes. Side Effects: loose stool/diarrhea  Riboflavin (vitamin B 2) 200 mg twice a day. This vitamin assists nerve cells in the production of ATP a principal energy storing molecule.  It is necessary for many chemical reactions in the body.  There have been at least 3 clinical trials of riboflavin using 400 mg per day all of which suggested that migraine frequency can be decreased.  All 3 trials showed significant improvement in over half of migraine sufferers.  The supplement is found in bread, cereal, milk, meat, and poultry.  Most Americans get more riboflavin than the recommended daily allowance, however riboflavin deficiency is not necessary for the supplements to help prevent headache. Side effects: energizing, green urine   Coenzyme Q10: This is present in almost all cells in the body and is critical component for the conversion of energy.  Recent studies have shown that a nutritional supplement of CoQ10 can reduce the frequency of migraine attacks by improving the energy production of cells as with riboflavin.  Doses of 150 mg twice a day have been shown to be effective.   Melatonin: Increasing evidence shows correlation between melatonin secretion and headache conditions.  Melatonin supplementation has decreased headache intensity and duration.  It is widely used as a sleep aid.  Sleep is natures way of dealing with migraine.  A dose of 3 mg is recommended to start for headaches including cluster headache. Higher doses up to 15 mg has been reviewed for use in Cluster headache and have been used. The rationale behind using melatonin for cluster is that many theories regarding the cause of Cluster headache center around the disruption of the normal circadian rhythm in the brain.  This helps restore the normal circadian rhythm.   HEADACHE DIET: Foods and beverages which may trigger migraine Note that only 20% of  headache patients are food sensitive. You will know if you are food sensitive if you get a headache consistently 20 minutes to 2 hours after eating a certain food. Only cut out a food if it causes headaches, otherwise you might remove foods you enjoy! What matters most for diet is to eat a well balanced healthy diet full of vegetables and low fat protein, and to not miss meals.   Chocolate, other sweets ALL cheeses except cottage and cream cheese Dairy products, yogurt, sour cream, ice cream Liver Meat extracts (Bovril, Marmite, meat tenderizers) Meats or fish which have undergone aging, fermenting, pickling or smoking. These include: Hotdogs,salami,Lox,sausage, mortadellas,smoked salmon, pepperoni, Pickled herring Pods of broad bean (English beans, Chinese pea pods, [place] (fava) beans, lima and navy beans Ripe avocado, ripe banana Yeast extracts or active yeast preparations such as Brewer's or Fleishman's (commercial bakes goods are permitted) Tomato based foods, pizza (lasagna, etc.)   MSG (monosodium glutamate) is disguised as many things; look for these common aliases: Monopotassium glutamate Autolysed yeast Hydrolysed protein Sodium caseinate "flavorings" "all natural preservatives" Nutrasweet   Avoid all other foods that convincingly provoke headaches.   Resources: The Dizzy [place] Laundry Your Headache Diet, migrainestrong.com  https://zamora-andrews.com/  Caffeine and Migraine For patients that have migraine, caffeine intake more than 3 days per week can lead to dependency and increased migraine frequency. I would recommend cutting back on your caffeine intake as best you can. The recommended amount of caffeine is 200-300 mg daily, although migraine patients may experience dependency at even lower doses. While you may notice an increase in headache temporarily, cutting back will be helpful for headaches in the long run. For more  information on caffeine and migraine, visit: https://americanmigrainefoundation.org/resource-library/caffeine-and-migraine/   Headache Prevention Strategies:   1. Maintain a headache diary; learn to identify and avoid triggers.  - This can be a simple note where you log when you had a headache, associated symptoms, and medications used - There are several smartphone apps developed to help track migraines: Migraine Buddy, Migraine Monitor, Curelator N1-Headache App   Common triggers include: Emotional triggers: Emotional/Upset family or friends Emotional/Upset occupation Business reversal/success Anticipation anxiety Crisis-serious Post-crisis periodNew job/position   Physical triggers: Vacation Day Weekend Strenuous Exercise High Altitude Location New Move Menstrual Day Physical Illness Oversleep/Not enough sleep Weather changes Light: Photophobia or light sesnitivity treatment involves a balance between desensitization and reduction in overly strong input. Use dark polarized glasses outside, but not inside. Avoid bright or fluorescent light, but do not dim environment to the point that going into a normally lit room hurts. Consider FL-41 tint lenses, which reduce the most irritating wavelengths without blocking too much light.  These can be obtained at axonoptics.com or theraspecs.com Foods: see list above.   2. Limit use of acute treatments (over-the-counter medications, triptans, etc.) to no more than 2 days per week or 10 days per month to prevent medication overuse headache (rebound headache).     3. Follow a regular schedule (including weekends and holidays): Don't skip meals. Eat a balanced diet. 8 hours of sleep nightly. Minimize stress. Exercise 30 minutes per day. Being overweight is associated with a 5 times increased risk of chronic migraine. Keep well hydrated and drink 6-8 glasses of water per day.   4. Initiate non-pharmacologic measures at the earliest onset of  your headache. Rest and quiet environment. Relax and reduce stress. Breathe2Relax is a free app that can instruct you on    some simple relaxtion and breathing techniques. Http://Dawnbuse.com is a    free website that provides teaching videos on relaxation.  Also, there are  many apps that   can be downloaded for "mindful" relaxation.  An app called YOGA NIDRA will help walk you through mindfulness. Another app called Calm can be downloaded to give you a structured mindfulness guide with daily reminders and skill development. Headspace for guided meditation Mindfulness Based Stress Reduction Online Course: www.palousemindfulness.com Cold compresses.   5. Don't wait!! Take the maximum allowable dosage of prescribed medication at the first sign of migraine.   6. Compliance:  Take prescribed medication regularly as directed and at the first sign of a migraine.   7. Communicate:  Call your physician when problems arise, especially if your headaches change, increase in frequency/severity, or become associated with neurological symptoms (weakness, numbness, slurred speech, etc.). Proceed to emergency room if you experience new or worsening symptoms or symptoms do not resolve, if you have new neurologic symptoms or if headache is severe, or for any concerning symptom.   8. Headache/pain management therapies: Consider various complementary methods, including medication, behavioral therapy, psychological counselling, biofeedback, massage therapy, acupuncture, dry needling, and other modalities.  Such measures may reduce the need for medications. Counseling  for pain management, where patients learn to function and ignore/minimize their pain, seems to work very well.   9. Recommend changing family's attention and focus away from patient's headaches. Instead, emphasize daily activities. If first question of day is 'How are your headaches/Do you have a headache today?', then patient will constantly think about  headaches, thus making them worse. Goal is to re-direct attention away from headaches, toward daily activities and other distractions.   10. Helpful Websites: www.AmericanHeadacheSociety.org PatentHood.ch www.headaches.org TightMarket.nl www.achenet.org

## 2023-05-14 NOTE — Progress Notes (Signed)
 PATIENT: Jazmynn Pho DOB: [birth year]  REASON FOR VISIT: follow up HISTORY FROM: patient  Virtual Visit via MyChart video  I connected with Conni Slipper on 05/20/23 at  8:15 AM EDT via MyChart video and verified that I am speaking with the correct person using two identifiers.   I discussed the limitations, risks, security and privacy concerns of performing an evaluation and management service by Mychart video and the availability of in person appointments. I also discussed with the patient that there may be a patient responsible charge related to this service. The patient expressed understanding and agreed to proceed.   History of Present Illness:  05/20/23 ALL (Mychart): Mesa Janus is a 58 y.o. female here today for follow up for migraines and non epileptic events. She continues lamotrigine 100mg  BID and topirmate 25mg  QHS. She continues sumatriptan works well for abortive therapy. She may use once every other month, on average. A few milder headaches, specifically with weather changes. She feels headaches re well managed. She also continues escitalopram 20mg  daily for mood management. She feels mood if good. She likes to walk when feeling stressed and this seems to help. No altered awareness events.   HISTORY (copied from Dr Bonnita Hollow previous note)  Shalisha Clausing is a 58 y.o. woman with spells of transient alteration of awareness and shaking     UPDATE 03/15/2022 She denies any more spells with shaking - none small or large since last visit.  No large spell since 2019.     She still has migraines but only 1 a month or less.  She is taking topiramate 25 mg qHS.  She takes Imitrex a few times a month.   She denies depression and remains on Lexapro and lamotrigine.   Anxiety was worse in the 1990's (after mom's stroke) and more when her father had health issues.   Her mother is nearly locked in and still alive, living in [place] now.     Neck pain has not been a problem.   She  has multilevel mild spinal stenosis mostly due to to mild degenerative changes combined with congenitally short pedicles.  The spinal cord has normal signal.  MRI of the cervical spine 09/27/2016 had shown mild spinal stenosis at C3-C4 and moderate right foraminal narrowing at C6-C7.   Spells of shaking and/or altered awareness: She had a seizure in 2011 with generalized tonic-clonic activity that occurred while on Wellbutrin and tramadol.  Starting in 2017, she began to experience some spells with altered awareness and sometimes some shaking but not loss of consciousness.  She had one EEG that was read as abnormal with right parietal and temporal slowing and she was started on lamotrigine.  We did an EEG 04/08/2017.  With hyperventilation she had shaking on the right side of her body but no loss of consciousness.  There is no seizure activity on EEG.  This lasted 15 minutes and I witnessed it.  It was consistent with psychogenic or nonepileptiform seizure.   Migraine history:   She has a long history of migraine headaches since her teen years. For the most part, these are common migraine headaches without aura. She will get pounding bilateral pain, photophobia, phonophobia and nausea and sometimes vomiting. Her head will make the pain worse.   On a combination of lamotrigine and Topamax, headaches occur 2-4 times a month.  Imitrex will help if 1 occurs.     Abnormal MRI/Possible MS:   Because of a spell of transient alteration of awareness  in August 2017, she had an MRI.   The MRI shows nonspecific white matter changes slightly progressed her to the 2011 MRI.  An outside neurologist diagnosed her with MS and prescribed Copaxone but she did not take it.  I personally reviewed the MRIs.    I believe that the changes most likely  represent mild chronic microvascular ischemic change or changes from the migraine headaches. I think demyelination from MS is significantly less likely so I did not think that she  should start a disease modifying therapy.  Also, a lumbar puncture was reportedly negative.  There is no family history of MS.    Observations/Objective:  Generalized: Well developed, in no acute distress  Mentation: Alert oriented to time, place, history taking. Follows all commands speech and language fluent   Assessment and Plan:  58 y.o. year old female  has a past medical history of Allergy, Anxiety, Cancer (HCC), Headache, High cholesterol, Melanoma (HCC), Multiple sclerosis (HCC), Osteopenia, Psoriasis, Seizures (HCC), and Vision abnormalities. here with    ICD-10-CM   1. Migraine without status migrainosus, not intractable, unspecified migraine type  G43.909     2. Anxiety state  F41.1      Zsofia is doing very well. We will continue lamotrigine, escitalopram and topiramate as prescribed. Continue sumatriptan as needed. Healthy lifestyle habits encouraged. She will follow up with me in 1 year, sooner if needed.    No orders of the defined types were placed in this encounter.   Meds ordered this encounter  Medications   escitalopram (LEXAPRO) 20 MG tablet    Sig: Take 1 tablet (20 mg total) by mouth daily.    Dispense:  90 tablet    Refill:  1    Supervising Provider:   Anson Fret [[id number]]   lamoTRIgine (LAMICTAL) 100 MG tablet    Sig: Take 1 tablet (100 mg total) by mouth 2 (two) times daily.    Dispense:  180 tablet    Refill:  3    Supervising Provider:   Anson Fret [[id number]]   SUMAtriptan (IMITREX) 100 MG tablet    Sig: Take 1 tablet (100 mg total) by mouth once as needed for up to 1 dose for migraine. May repeat in 2 hours if headache persists or recurs.    Dispense:  10 tablet    Refill:  5    Supervising Provider:   Anson Fret [[id number]]   topiramate (TOPAMAX) 25 MG tablet    Sig: Take 1 tablet (25 mg total) by mouth at bedtime. One po qHS    Dispense:  90 tablet    Refill:  4    Supervising Provider:   Anson Fret [[id number]]      Follow Up Instructions:  I discussed the assessment and treatment plan with the patient. The patient was provided an opportunity to ask questions and all were answered. The patient agreed with the plan and demonstrated an understanding of the instructions.   The patient was advised to call back or seek an in-person evaluation if the symptoms worsen or if the condition fails to improve as anticipated.  I provided 15 minutes of face-to-face and non face-to-face time during this MyChart video encounter. Patient located at their place of residence. Provider is in the office.    Shawnie Dapper, NP

## 2023-05-20 ENCOUNTER — Telehealth (INDEPENDENT_AMBULATORY_CARE_PROVIDER_SITE_OTHER): Payer: BC Managed Care – PPO | Admitting: Family Medicine

## 2023-05-20 ENCOUNTER — Encounter: Payer: Self-pay | Admitting: Family Medicine

## 2023-05-20 DIAGNOSIS — G43909 Migraine, unspecified, not intractable, without status migrainosus: Secondary | ICD-10-CM | POA: Diagnosis not present

## 2023-05-20 DIAGNOSIS — F411 Generalized anxiety disorder: Secondary | ICD-10-CM

## 2023-05-20 MED ORDER — SUMATRIPTAN SUCCINATE 100 MG PO TABS: 100.0000 mg | ORAL_TABLET | Freq: Once | ORAL | 5 refills | Status: DC | PRN

## 2023-05-20 MED ORDER — ESCITALOPRAM OXALATE 20 MG PO TABS: 20.0000 mg | ORAL_TABLET | Freq: Every day | ORAL | 1 refills | Status: DC

## 2023-05-20 MED ORDER — TOPIRAMATE 25 MG PO TABS: 25.0000 mg | ORAL_TABLET | Freq: Every day | ORAL | 4 refills | Status: AC

## 2023-05-20 MED ORDER — LAMOTRIGINE 100 MG PO TABS: 100.0000 mg | ORAL_TABLET | Freq: Two times a day (BID) | ORAL | 3 refills | Status: AC

## 2023-09-22 ENCOUNTER — Other Ambulatory Visit: Payer: Self-pay | Admitting: Family Medicine

## 2023-09-23 NOTE — Telephone Encounter (Signed)
 Last seen on 05/20/23 Follow up scheduled on 05/27/23

## 2023-11-27 ENCOUNTER — Other Ambulatory Visit: Payer: Self-pay | Admitting: *Deleted

## 2023-11-27 MED ORDER — SUMATRIPTAN SUCCINATE 100 MG PO TABS: 100.0000 mg | ORAL_TABLET | Freq: Once | ORAL | 5 refills | Status: AC | PRN

## 2023-11-27 NOTE — Telephone Encounter (Signed)
 Last seen on 05/20/23 Follow up scheduled on 05/26/24

## 2024-01-12 ENCOUNTER — Encounter: Payer: Self-pay | Admitting: Family Medicine

## 2024-03-26 ENCOUNTER — Other Ambulatory Visit: Payer: Self-pay | Admitting: Family Medicine

## 2024-05-26 ENCOUNTER — Telehealth: Admitting: Family Medicine
